# Patient Record
Sex: Female | Born: 1935 | Race: Black or African American | Hispanic: No | Smoking: Never smoker
Health system: Southern US, Community
[De-identification: ages and names within clinical notes are randomized; demographics above are authoritative.]

## PROBLEM LIST (undated history)

## (undated) DIAGNOSIS — J189 Pneumonia, unspecified organism: Secondary | ICD-10-CM

## (undated) DIAGNOSIS — G309 Alzheimer's disease, unspecified: Secondary | ICD-10-CM

## (undated) DIAGNOSIS — I10 Essential (primary) hypertension: Secondary | ICD-10-CM

## (undated) DIAGNOSIS — E559 Vitamin D deficiency, unspecified: Secondary | ICD-10-CM

## (undated) DIAGNOSIS — F800 Phonological disorder: Secondary | ICD-10-CM

## (undated) DIAGNOSIS — G4700 Insomnia, unspecified: Secondary | ICD-10-CM

## (undated) DIAGNOSIS — F259 Schizoaffective disorder, unspecified: Secondary | ICD-10-CM

## (undated) DIAGNOSIS — Z7409 Other reduced mobility: Secondary | ICD-10-CM

## (undated) DIAGNOSIS — R569 Unspecified convulsions: Secondary | ICD-10-CM

## (undated) DIAGNOSIS — F0280 Dementia in other diseases classified elsewhere without behavioral disturbance: Secondary | ICD-10-CM

## (undated) DIAGNOSIS — F79 Unspecified intellectual disabilities: Secondary | ICD-10-CM

## (undated) DIAGNOSIS — Z9289 Personal history of other medical treatment: Secondary | ICD-10-CM

## (undated) DIAGNOSIS — E785 Hyperlipidemia, unspecified: Secondary | ICD-10-CM

## (undated) DIAGNOSIS — K219 Gastro-esophageal reflux disease without esophagitis: Secondary | ICD-10-CM

## (undated) HISTORY — PX: STOMACH SURGERY: SHX791

---

## 2014-04-29 ENCOUNTER — Emergency Department (HOSPITAL_COMMUNITY): Payer: MEDICARE

## 2014-04-29 ENCOUNTER — Encounter (HOSPITAL_COMMUNITY): Payer: Self-pay | Admitting: Emergency Medicine

## 2014-04-29 ENCOUNTER — Emergency Department (HOSPITAL_COMMUNITY)
Admission: EM | Admit: 2014-04-29 | Discharge: 2014-04-29 | Disposition: A | Discharge status: Other Acute Inpatient Hospital | Payer: MEDICARE | Attending: Emergency Medicine | Admitting: Emergency Medicine

## 2014-04-29 DIAGNOSIS — Y9389 Activity, other specified: Secondary | ICD-10-CM | POA: Diagnosis not present

## 2014-04-29 DIAGNOSIS — W1839XA Other fall on same level, initial encounter: Secondary | ICD-10-CM | POA: Insufficient documentation

## 2014-04-29 DIAGNOSIS — R55 Syncope and collapse: Secondary | ICD-10-CM | POA: Diagnosis present

## 2014-04-29 DIAGNOSIS — I10 Essential (primary) hypertension: Secondary | ICD-10-CM | POA: Insufficient documentation

## 2014-04-29 DIAGNOSIS — Y998 Other external cause status: Secondary | ICD-10-CM | POA: Insufficient documentation

## 2014-04-29 DIAGNOSIS — F0281 Dementia in other diseases classified elsewhere with behavioral disturbance: Secondary | ICD-10-CM | POA: Diagnosis not present

## 2014-04-29 DIAGNOSIS — Z8639 Personal history of other endocrine, nutritional and metabolic disease: Secondary | ICD-10-CM | POA: Diagnosis not present

## 2014-04-29 DIAGNOSIS — R000 Tachycardia, unspecified: Secondary | ICD-10-CM | POA: Diagnosis not present

## 2014-04-29 DIAGNOSIS — W19XXXA Unspecified fall, initial encounter: Secondary | ICD-10-CM

## 2014-04-29 DIAGNOSIS — Z041 Encounter for examination and observation following transport accident: Secondary | ICD-10-CM | POA: Diagnosis not present

## 2014-04-29 DIAGNOSIS — G309 Alzheimer's disease, unspecified: Secondary | ICD-10-CM | POA: Insufficient documentation

## 2014-04-29 DIAGNOSIS — Z8719 Personal history of other diseases of the digestive system: Secondary | ICD-10-CM | POA: Insufficient documentation

## 2014-04-29 DIAGNOSIS — Y92000 Kitchen of unspecified non-institutional (private) residence as  the place of occurrence of the external cause: Secondary | ICD-10-CM | POA: Diagnosis not present

## 2014-04-29 HISTORY — DX: Alzheimer's disease, unspecified: G30.9

## 2014-04-29 HISTORY — DX: Gastro-esophageal reflux disease without esophagitis: K21.9

## 2014-04-29 HISTORY — DX: Unspecified convulsions: R56.9

## 2014-04-29 HISTORY — DX: Schizoaffective disorder, unspecified: F25.9

## 2014-04-29 HISTORY — DX: Essential (primary) hypertension: I10

## 2014-04-29 HISTORY — DX: Insomnia, unspecified: G47.00

## 2014-04-29 HISTORY — DX: Vitamin D deficiency, unspecified: E55.9

## 2014-04-29 HISTORY — DX: Dementia in other diseases classified elsewhere, unspecified severity, without behavioral disturbance, psychotic disturbance, mood disturbance, and anxiety: F02.80

## 2014-04-29 HISTORY — DX: Hyperlipidemia, unspecified: E78.5

## 2014-04-29 LAB — COMPREHENSIVE METABOLIC PANEL
ALK PHOS: 85 U/L (ref 39–117)
ALT: 12 U/L (ref 0–35)
AST: 20 U/L (ref 0–37)
Albumin: 3.2 g/dL — ABNORMAL LOW (ref 3.5–5.2)
Anion gap: 9 (ref 5–15)
BILIRUBIN TOTAL: 0.4 mg/dL (ref 0.3–1.2)
BUN: 13 mg/dL (ref 6–23)
CHLORIDE: 101 meq/L (ref 96–112)
CO2: 29 meq/L (ref 19–32)
Calcium: 9.3 mg/dL (ref 8.4–10.5)
Creatinine, Ser: 0.69 mg/dL (ref 0.50–1.10)
GFR, EST NON AFRICAN AMERICAN: 78 mL/min — AB (ref >90)
GLUCOSE: 144 mg/dL — AB (ref 70–99)
POTASSIUM: 3.8 meq/L (ref 3.7–5.3)
SODIUM: 139 meq/L (ref 137–147)
TOTAL PROTEIN: 7.2 g/dL (ref 6.0–8.3)

## 2014-04-29 LAB — CBC WITH DIFFERENTIAL/PLATELET
Basophils Absolute: 0 10*3/uL (ref 0.0–0.1)
Basophils Relative: 0 % (ref 0–1)
Eosinophils Absolute: 0 10*3/uL (ref 0.0–0.7)
Eosinophils Relative: 0 % (ref 0–5)
HCT: 38.1 % (ref 36.0–46.0)
Hemoglobin: 12.6 g/dL (ref 12.0–15.0)
LYMPHS PCT: 13 % (ref 12–46)
Lymphs Abs: 0.5 10*3/uL — ABNORMAL LOW (ref 0.7–4.0)
MCH: 30.9 pg (ref 26.0–34.0)
MCHC: 33.1 g/dL (ref 30.0–36.0)
MCV: 93.4 fL (ref 78.0–100.0)
Monocytes Absolute: 0.4 10*3/uL (ref 0.1–1.0)
Monocytes Relative: 10 % (ref 3–12)
NEUTROS PCT: 77 % (ref 43–77)
Neutro Abs: 3.1 10*3/uL (ref 1.7–7.7)
PLATELETS: 123 10*3/uL — AB (ref 150–400)
RBC: 4.08 MIL/uL (ref 3.87–5.11)
RDW: 13.7 % (ref 11.5–15.5)
WBC: 4.1 10*3/uL (ref 4.0–10.5)

## 2014-04-29 LAB — URINALYSIS, ROUTINE W REFLEX MICROSCOPIC
BILIRUBIN URINE: NEGATIVE
Glucose, UA: NEGATIVE mg/dL
HGB URINE DIPSTICK: NEGATIVE
KETONES UR: NEGATIVE mg/dL
Leukocytes, UA: NEGATIVE
Nitrite: NEGATIVE
Protein, ur: NEGATIVE mg/dL
Specific Gravity, Urine: 1.02 (ref 1.005–1.030)
UROBILINOGEN UA: 1 mg/dL (ref 0.0–1.0)
pH: 7 (ref 5.0–8.0)

## 2014-04-29 NOTE — Discharge Instructions (Signed)
Fall Prevention and Home Safety Falls cause injuries and can affect all age groups. It is possible to use preventive measures to significantly decrease the likelihood of falls. There are many simple measures which can make your home safer and prevent falls. OUTDOORS  Repair cracks and edges of walkways and driveways.  Remove high doorway thresholds.  Trim shrubbery on the main path into your home.  Have good outside lighting.  Clear walkways of tools, rocks, debris, and clutter.  Check that handrails are not broken and are securely fastened. Both sides of steps should have handrails.  Have leaves, snow, and ice cleared regularly.  Use sand or salt on walkways during winter months.  In the garage, clean up grease or oil spills. BATHROOM  Install night lights.  Install grab bars by the toilet and in the tub and shower.  Use non-skid mats or decals in the tub or shower.  Place a plastic non-slip stool in the shower to sit on, if needed.  Keep floors dry and clean up all water on the floor immediately.  Remove soap buildup in the tub or shower on a regular basis.  Secure bath mats with non-slip, double-sided rug tape.  Remove throw rugs and tripping hazards from the floors. BEDROOMS  Install night lights.  Make sure a bedside light is easy to reach.  Do not use oversized bedding.  Keep a telephone by your bedside.  Have a firm chair with side arms to use for getting dressed.  Remove throw rugs and tripping hazards from the floor. KITCHEN  Keep handles on pots and pans turned toward the center of the stove. Use back burners when possible.  Clean up spills quickly and allow time for drying.  Avoid walking on wet floors.  Avoid hot utensils and knives.  Position shelves so they are not too high or low.  Place commonly used objects within easy reach.  If necessary, use a sturdy step stool with a grab bar when reaching.  Keep electrical cables out of the  way.  Do not use floor polish or wax that makes floors slippery. If you must use wax, use non-skid floor wax.  Remove throw rugs and tripping hazards from the floor. STAIRWAYS  Never leave objects on stairs.  Place handrails on both sides of stairways and use them. Fix any loose handrails. Make sure handrails on both sides of the stairways are as long as the stairs.  Check carpeting to make sure it is firmly attached along stairs. Make repairs to worn or loose carpet promptly.  Avoid placing throw rugs at the top or bottom of stairways, or properly secure the rug with carpet tape to prevent slippage. Get rid of throw rugs, if possible.  Have an electrician put in a light switch at the top and bottom of the stairs. OTHER FALL PREVENTION TIPS  Wear low-heel or rubber-soled shoes that are supportive and fit well. Wear closed toe shoes.  When using a stepladder, make sure it is fully opened and both spreaders are firmly locked. Do not climb a closed stepladder.  Add color or contrast paint or tape to grab bars and handrails in your home. Place contrasting color strips on first and last steps.  Learn and use mobility aids as needed. Install an electrical emergency response system.  Turn on lights to avoid dark areas. Replace light bulbs that burn out immediately. Get light switches that glow.  Arrange furniture to create clear pathways. Keep furniture in the same place.  Firmly attach carpet with non-skid or double-sided tape.  Eliminate uneven floor surfaces.  Select a carpet pattern that does not visually hide the edge of steps.  Be aware of all pets. OTHER HOME SAFETY TIPS  Set the water temperature for 120 F (48.8 C).  Keep emergency numbers on or near the telephone.  Keep smoke detectors on every level of the home and near sleeping areas. Document Released: 05/09/2002 Document Revised: 11/18/2011 Document Reviewed: 08/08/2011 [facility] Patient Information 2015  [place], [place]. This information is not intended to replace advice given to you by your health care provider. Make sure you discuss any questions you have with your health care provider.    Syncope Syncope is a medical term for fainting or passing out. This means you lose consciousness and drop to the ground. People are generally unconscious for less than 5 minutes. You may have some muscle twitches for up to 15 seconds before waking up and returning to normal. Syncope occurs more often in older adults, but it can happen to anyone. While most causes of syncope are not dangerous, syncope can be a sign of a serious medical problem. It is important to seek medical care.  CAUSES  Syncope is caused by a sudden drop in blood flow to the brain. The specific cause is often not determined. Factors that can bring on syncope include:  Taking medicines that lower blood pressure.  Sudden changes in posture, such as standing up quickly.  Taking more medicine than prescribed.  Standing in one place for too long.  Seizure disorders.  Dehydration and excessive exposure to heat.  Low blood sugar (hypoglycemia).  Straining to have a bowel movement.  Heart disease, irregular heartbeat, or other circulatory problems.  Fear, emotional distress, seeing blood, or severe pain. SYMPTOMS  Right before fainting, you may:  Feel dizzy or light-headed.  Feel nauseous.  See all white or all black in your field of vision.  Have cold, clammy skin. DIAGNOSIS  Your health care provider will ask about your symptoms, perform a physical exam, and perform an electrocardiogram (ECG) to record the electrical activity of your heart. Your health care provider may also perform other heart or blood tests to determine the cause of your syncope which may include:  Transthoracic echocardiogram (TTE). During echocardiography, sound waves are used to evaluate how blood flows through your heart.  Transesophageal  echocardiogram (TEE).  Cardiac monitoring. This allows your health care provider to monitor your heart rate and rhythm in real time.  Holter monitor. This is a portable device that records your heartbeat and can help diagnose heart arrhythmias. It allows your health care provider to track your heart activity for several days, if needed.  Stress tests by exercise or by giving medicine that makes the heart beat faster. TREATMENT  In most cases, no treatment is needed. Depending on the cause of your syncope, your health care provider may recommend changing or stopping some of your medicines. HOME CARE INSTRUCTIONS  Have someone stay with you until you feel stable.  Do not drive, use machinery, or play sports until your health care provider says it is okay.  Keep all follow-up appointments as directed by your health care provider.  Lie down right away if you start feeling like you might faint. Breathe deeply and steadily. Wait until all the symptoms have passed.  Drink enough fluids to keep your urine clear or pale yellow.  If you are taking blood pressure or heart medicine, get up slowly and  take several minutes to sit and then stand. This can reduce dizziness. SEEK IMMEDIATE MEDICAL CARE IF:   You have a severe headache.  You have unusual pain in the chest, abdomen, or back.  You are bleeding from your mouth or rectum, or you have black or tarry stool.  You have an irregular or very fast heartbeat.  You have pain with breathing.  You have repeated fainting or seizure-like jerking during an episode.  You faint when sitting or lying down.  You have confusion.  You have trouble walking.  You have severe weakness.  You have vision problems. If you fainted, call your local emergency services (911 in [place] Do not drive yourself to the hospital.  MAKE SURE YOU:  Understand these instructions.  Will watch your condition.  Will get help right away if you are not doing well  or get worse. Document Released: 05/19/2005 Document Revised: 05/24/2013 Document Reviewed: 07/18/2011 [facility]Exit[facility] Patient Information 2015 [place], [place]. This information is not intended to replace advice given to you by your health care provider. Make sure you discuss any questions you have with your health care provider.   Seizure, Adult A seizure is abnormal electrical activity in the brain. Seizures usually last from 30 seconds to 2 minutes. There are various types of seizures. Before a seizure, you may have a warning sensation (aura) that a seizure is about to occur. An aura may include the following symptoms:   Fear or anxiety.  Nausea.  Feeling like the room is spinning (vertigo).  Vision changes, such as seeing flashing lights or spots. Common symptoms during a seizure include:  A change in attention or behavior (altered mental status).  Convulsions with rhythmic jerking movements.  Drooling.  Rapid eye movements.  Grunting.  Loss of bladder and bowel control.  Bitter taste in the mouth.  Tongue biting. After a seizure, you may feel confused and sleepy. You may also have an injury resulting from convulsions during the seizure. HOME CARE INSTRUCTIONS   If you are given medicines, take them exactly as prescribed by your health care provider.  Keep all follow-up appointments as directed by your health care provider.  Do not swim or drive or engage in risky activity during which a seizure could cause further injury to you or others until your health care provider says it is OK.  Get adequate rest.  Teach friends and family what to do if you have a seizure. They should:  Lay you on the ground to prevent a fall.  Put a cushion under your head.  Loosen any tight clothing around your neck.  Turn you on your side. If vomiting occurs, this helps keep your airway clear.  Stay with you until you recover.  Know whether or not you need emergency care. SEEK IMMEDIATE  MEDICAL CARE IF:  The seizure lasts longer than 5 minutes.  The seizure is severe or you do not wake up immediately after the seizure.  You have an altered mental status after the seizure.  You are having more frequent or worsening seizures. Someone should drive you to the emergency department or call local emergency services (911 in [place] MAKE SURE YOU:  Understand these instructions.  Will watch your condition.  Will get help right away if you are not doing well or get worse. Document Released: 05/16/2000 Document Revised: 03/09/2013 Document Reviewed: 12/29/2012 [facility]Exit[facility] Patient Information 2015 [place]Care, [place]. This information is not intended to replace advice given to you by your health care provider.  Make sure you discuss any questions you have with your health care provider. ° ° °

## 2014-04-29 NOTE — ED Provider Notes (Signed)
CSN: [id number]     Arrival date & time 04/29/14  [id number] History   First MD Initiated Contact with Patient 04/29/14 0745    This chart was scribed for [name] CamelScott T Iness Pangilinan, MD by Marica OtterNusrat Rahman, ED Scribe. This patient was seen in room APA06/APA06 and the patient's care was started at 7:48 AM. Level 5 Caveat: Dementia  Chief Complaint  Patient presents with  . Loss of Consciousness   The history is provided by the EMS personnel and the nursing home. No language interpreter was used.   PCP: No primary care provider on file. HPI Comments: [name] Reynolds is a 80 y.o. female, who resides in assisted living and brought in by ambulance, with medical Hx noted below including dementia, seizures, who presents to the Emergency Department complaining of a fall with associated  LOC sustained prior to arrival to the ED. Per assisted living representative, pt was sitting in a chair and had an unwitnessed fall. Representative further notes that pt was unresponsive following the fall and believes that pt may have been having a seizure episode due to shaking noticed. Representative denies vomiting. Unknown if she passed out and fell, or if she lost consciousness after the fall.  Past Medical History  Diagnosis Date  . HTN (hypertension)   . Hyperlipemia   . Seizures   . GERD (gastroesophageal reflux disease)   . Alzheimer's dementia   . Insomnia   . Vitamin D deficiency   . Schizoaffective disorder    Past Surgical History  Procedure Laterality Date  . Stomach surgery     No family history on file. History  Substance Use Topics  . Smoking status: Never Smoker   . Smokeless tobacco: Not on file  . Alcohol Use: No   OB History    No data available     Review of Systems  Unable to perform ROS: Dementia      Allergies  Review of patient's allergies indicates no known allergies.  Home Medications   Prior to Admission medications   Not on File   Triage Vitals: BP 138/84 mmHg  Pulse 108   Temp(Src) 98.1 F (36.7 C) (Oral)  Resp 20  Ht 5\' 6"  (1.676 m)  Wt 120 lb (54.432 kg)  BMI 19.38 kg/m2  SpO2 100% Physical Exam  Constitutional: She appears well-developed and well-nourished. No distress.  Cachectic appearance.   HENT:  Head: Normocephalic and atraumatic.  Right Ear: External ear normal.  Left Ear: External ear normal.  Nose: Nose normal.  Eyes: Pupils are equal, round, and reactive to light.  Neck: Neck supple. No tracheal deviation present.  Cardiovascular: Regular rhythm and normal heart sounds.  Tachycardia present.   Pulmonary/Chest: Effort normal. No respiratory distress.  Abdominal: Soft. She exhibits no distension. There is no tenderness.  Musculoskeletal: Normal range of motion.  No gross tenderness over pelvis or hips. No pain with ROM of extremities  Neurological: She is alert.  Nonverbal and does not follow commands. Strength appears nl in all 4 extremities. Moving all 4 extremities grossly normally.    Skin: Skin is warm and dry.  Psychiatric: She has a normal mood and affect. Her behavior is normal.  Nursing note and vitals reviewed.   ED Course  Procedures (including critical care time) DIAGNOSTIC STUDIES: Oxygen Saturation is 100% on RA, nl by my interpretation.     Labs Review Labs Reviewed  CBC WITH DIFFERENTIAL - Abnormal; Notable for the following:    Platelets 123 (*)  Lymphs Abs 0.5 (*)    All other components within normal limits  COMPREHENSIVE METABOLIC PANEL - Abnormal; Notable for the following:    Glucose, Bld 144 (*)    Albumin 3.2 (*)    GFR calc non Af Amer 78 (*)    All other components within normal limits  URINALYSIS, ROUTINE W REFLEX MICROSCOPIC    Imaging Review Ct Head Wo Contrast  04/29/2014   CLINICAL DATA:  Loss of consciousness secondary to a fall today. Possible seizure. History of seizures. Dementia.  EXAM: CT HEAD WITHOUT CONTRAST  TECHNIQUE: Contiguous axial images were obtained from the base of the  skull through the vertex without intravenous contrast.  COMPARISON:  None.  FINDINGS: There is no acute intracranial hemorrhage, infarction, or mass lesion. There are old small lacunar infarcts in the anterior limbs of both internal capsules. There is no ventricular dilatation or significant atrophy. No osseous abnormality.  IMPRESSION: No acute intracranial abnormality.  Old lacunar infarcts.   Electronically Signed   By: Geanie CooleyJim  Maxwell M.D.   On: 04/29/2014 08:43     EKG Interpretation   Date/Time:  Saturday April 29 2014 07:39:18 EST Ventricular Rate:  106 PR Interval:  178 QRS Duration: 72 QT Interval:  351 QTC Calculation: 466 R Axis:   35 Text Interpretation:  Sinus tachycardia Abnormal R-wave progression, early  transition No old tracing to compare Confirmed by Matej Sappenfield  MD, Yumi Insalaco  ([id number]) on 04/29/2014 7:59:50 AM      MDM   Final diagnoses:  Fall, initial encounter    Patient with syncope vs seizure. Patient is unable to provide a history, but is currently on treatment of chronic history of seizures. Workup is unremarkable except mild tachycardia. However patient is at mental baseline per multiple visitors and appears to have no acute emergent condition. I feel this was likely a fall from chair vs brief seizure. Did discuss possible observation for possibility of syncope, but sister wants to take her back to group home and f/u with her PCP.  I personally performed the services described in this documentation, which was scribed in my presence. The recorded information has been reviewed and is accurate.     [name] CamelScott T Atticus Wedin, MD 04/30/14 [phone]

## 2014-04-29 NOTE — ED Notes (Signed)
ALF reported to EMS that pt was sitting in a chair in the kitchen and fell to the floor with a period of LOC. PT has dementia and is non-verbal with hx of alzheimer's. PT alert and obeys commands on arrival to ED. EMS reports sinus tach on monitor with glucose of 106.

## 2014-10-11 ENCOUNTER — Inpatient Hospital Stay (HOSPITAL_COMMUNITY)
Admission: EM | Admit: 2014-10-11 | Discharge: 2014-10-13 | DRG: 871 | Disposition: A | Discharge status: Home / Self Care | Payer: MEDICARE | Attending: Internal Medicine | Admitting: Internal Medicine

## 2014-10-11 ENCOUNTER — Emergency Department (HOSPITAL_COMMUNITY): Payer: MEDICARE

## 2014-10-11 ENCOUNTER — Encounter (HOSPITAL_COMMUNITY): Payer: Self-pay

## 2014-10-11 DIAGNOSIS — E860 Dehydration: Secondary | ICD-10-CM | POA: Diagnosis present

## 2014-10-11 DIAGNOSIS — G309 Alzheimer's disease, unspecified: Secondary | ICD-10-CM | POA: Diagnosis present

## 2014-10-11 DIAGNOSIS — F0280 Dementia in other diseases classified elsewhere without behavioral disturbance: Secondary | ICD-10-CM | POA: Diagnosis present

## 2014-10-11 DIAGNOSIS — F259 Schizoaffective disorder, unspecified: Secondary | ICD-10-CM | POA: Diagnosis present

## 2014-10-11 DIAGNOSIS — R7401 Elevation of levels of liver transaminase levels: Secondary | ICD-10-CM

## 2014-10-11 DIAGNOSIS — A419 Sepsis, unspecified organism: Principal | ICD-10-CM | POA: Diagnosis present

## 2014-10-11 DIAGNOSIS — R740 Nonspecific elevation of levels of transaminase and lactic acid dehydrogenase [LDH]: Secondary | ICD-10-CM

## 2014-10-11 DIAGNOSIS — G9340 Encephalopathy, unspecified: Secondary | ICD-10-CM | POA: Diagnosis present

## 2014-10-11 DIAGNOSIS — R4182 Altered mental status, unspecified: Secondary | ICD-10-CM

## 2014-10-11 DIAGNOSIS — E785 Hyperlipidemia, unspecified: Secondary | ICD-10-CM | POA: Diagnosis present

## 2014-10-11 DIAGNOSIS — Z681 Body mass index (BMI) 19 or less, adult: Secondary | ICD-10-CM

## 2014-10-11 DIAGNOSIS — B349 Viral infection, unspecified: Secondary | ICD-10-CM | POA: Diagnosis present

## 2014-10-11 DIAGNOSIS — I10 Essential (primary) hypertension: Secondary | ICD-10-CM | POA: Diagnosis present

## 2014-10-11 DIAGNOSIS — R627 Adult failure to thrive: Secondary | ICD-10-CM | POA: Diagnosis present

## 2014-10-11 DIAGNOSIS — R109 Unspecified abdominal pain: Secondary | ICD-10-CM

## 2014-10-11 DIAGNOSIS — K219 Gastro-esophageal reflux disease without esophagitis: Secondary | ICD-10-CM | POA: Diagnosis present

## 2014-10-11 DIAGNOSIS — E43 Unspecified severe protein-calorie malnutrition: Secondary | ICD-10-CM | POA: Diagnosis present

## 2014-10-11 LAB — CBC WITH DIFFERENTIAL/PLATELET
Basophils Absolute: 0 10*3/uL (ref 0.0–0.1)
Basophils Relative: 0 % (ref 0–1)
EOS ABS: 0 10*3/uL (ref 0.0–0.7)
Eosinophils Relative: 0 % (ref 0–5)
HCT: 39.6 % (ref 36.0–46.0)
HEMOGLOBIN: 13.2 g/dL (ref 12.0–15.0)
LYMPHS ABS: 0.3 10*3/uL — AB (ref 0.7–4.0)
Lymphocytes Relative: 4 % — ABNORMAL LOW (ref 12–46)
MCH: 32.1 pg (ref 26.0–34.0)
MCHC: 33.3 g/dL (ref 30.0–36.0)
MCV: 96.4 fL (ref 78.0–100.0)
Monocytes Absolute: 0.5 10*3/uL (ref 0.1–1.0)
Monocytes Relative: 6 % (ref 3–12)
NEUTROS ABS: 7.4 10*3/uL (ref 1.7–7.7)
Neutrophils Relative %: 90 % — ABNORMAL HIGH (ref 43–77)
Platelets: 108 10*3/uL — ABNORMAL LOW (ref 150–400)
RBC: 4.11 MIL/uL (ref 3.87–5.11)
RDW: 13.6 % (ref 11.5–15.5)
WBC: 8.2 10*3/uL (ref 4.0–10.5)

## 2014-10-11 LAB — COMPREHENSIVE METABOLIC PANEL
ALBUMIN: 3.7 g/dL (ref 3.5–5.0)
ALK PHOS: 236 U/L — AB (ref 38–126)
ALT: 299 U/L — ABNORMAL HIGH (ref 14–54)
ANION GAP: 8 (ref 5–15)
AST: 335 U/L — AB (ref 15–41)
BUN: 22 mg/dL — AB (ref 6–20)
CO2: 29 mmol/L (ref 22–32)
Calcium: 9.8 mg/dL (ref 8.9–10.3)
Chloride: 101 mmol/L (ref 101–111)
Creatinine, Ser: 0.77 mg/dL (ref 0.44–1.00)
GFR calc Af Amer: >60 mL/min (ref >60)
GFR calc non Af Amer: >60 mL/min (ref >60)
Glucose, Bld: 140 mg/dL — ABNORMAL HIGH (ref 70–99)
POTASSIUM: 4.2 mmol/L (ref 3.5–5.1)
Sodium: 138 mmol/L (ref 135–145)
TOTAL PROTEIN: 8.3 g/dL — AB (ref 6.5–8.1)
Total Bilirubin: 2.9 mg/dL — ABNORMAL HIGH (ref 0.3–1.2)

## 2014-10-11 LAB — URINALYSIS, ROUTINE W REFLEX MICROSCOPIC
GLUCOSE, UA: 100 mg/dL — AB
HGB URINE DIPSTICK: NEGATIVE
KETONES UR: NEGATIVE mg/dL
LEUKOCYTES UA: NEGATIVE
Nitrite: NEGATIVE
PROTEIN: NEGATIVE mg/dL
Specific Gravity, Urine: >1.03 — ABNORMAL HIGH (ref 1.005–1.030)
pH: 5.5 (ref 5.0–8.0)

## 2014-10-11 LAB — LIPASE, BLOOD: Lipase: 20 U/L — ABNORMAL LOW (ref 22–51)

## 2014-10-11 LAB — I-STAT CG4 LACTIC ACID, ED
LACTIC ACID, VENOUS: 2.21 mmol/L — AB (ref 0.5–2.0)
LACTIC ACID, VENOUS: 1.2 mmol/L (ref 0.5–2.0)

## 2014-10-11 MED ORDER — PIPERACILLIN-TAZOBACTAM 3.375 G IVPB
3.3750 g | Freq: Three times a day (TID) | INTRAVENOUS | Status: DC
  Administered 2014-10-12 – 2014-10-13 (×4): 3.375 g via INTRAVENOUS
  Filled 2014-10-11 (×14): qty 50

## 2014-10-11 MED ORDER — VANCOMYCIN HCL IN DEXTROSE 1-5 GM/200ML-% IV SOLN
1000.0000 mg | Freq: Once | INTRAVENOUS | Status: AC
  Administered 2014-10-11: 1000 mg via INTRAVENOUS
  Filled 2014-10-11: qty 200

## 2014-10-11 MED ORDER — VANCOMYCIN HCL IN DEXTROSE 750-5 MG/150ML-% IV SOLN
750.0000 mg | INTRAVENOUS | Status: DC
  Administered 2014-10-12: 750 mg via INTRAVENOUS
  Filled 2014-10-11 (×4): qty 150

## 2014-10-11 MED ORDER — PIPERACILLIN-TAZOBACTAM 3.375 G IVPB 30 MIN
3.3750 g | Freq: Once | INTRAVENOUS | Status: AC
  Administered 2014-10-11: 3.375 g via INTRAVENOUS
  Filled 2014-10-11: qty 50

## 2014-10-11 MED ORDER — IOHEXOL 300 MG/ML  SOLN
100.0000 mL | Freq: Once | INTRAMUSCULAR | Status: AC | PRN
  Administered 2014-10-11: 100 mL via INTRAVENOUS

## 2014-10-11 MED ORDER — SODIUM CHLORIDE 0.9 % IV BOLUS (SEPSIS)
500.0000 mL | Freq: Once | INTRAVENOUS | Status: AC
  Administered 2014-10-11: 500 mL via INTRAVENOUS

## 2014-10-11 MED ORDER — SODIUM CHLORIDE 0.9 % IV SOLN: INTRAVENOUS | Status: DC

## 2014-10-11 MED ORDER — SODIUM CHLORIDE 0.9 % IV BOLUS (SEPSIS)
500.0000 mL | Freq: Once | INTRAVENOUS | Status: AC
Start: 2014-10-11 — End: 2014-10-11
  Administered 2014-10-11: 500 mL via INTRAVENOUS

## 2014-10-11 NOTE — ED Notes (Signed)
Transported by EMS for AMS. Caregivers stated pt is less responsive today than usual.

## 2014-10-11 NOTE — Progress Notes (Signed)
ANTIBIOTIC CONSULT NOTE  Pharmacy Consult for Vancomycin & Zosyn  Indication: rule out sepsis  No Known Allergies  Patient Measurements:    Vital Signs: Temp: 99.1 F (37.3 C) (05/11 1622) Temp Source: Oral (05/11 1622) BP: 139/73 mmHg (05/11 2200) Pulse Rate: 107 (05/11 2200) Intake/Output from previous day:   Intake/Output from this shift:    Labs:  Recent Labs  10/11/14 1815  WBC 8.2  HGB 13.2  PLT 108*  CREATININE 0.77   CrCl cannot be calculated (Unknown ideal weight.). No results for input(s): VANCOTROUGH, VANCOPEAK, VANCORANDOM, GENTTROUGH, GENTPEAK, GENTRANDOM, TOBRATROUGH, TOBRAPEAK, TOBRARND, AMIKACINPEAK, AMIKACINTROU, AMIKACIN in the last 72 hours.   Microbiology: Recent Results (from the past 720 hour(s))  Culture, blood (routine x 2)     Status: None (Preliminary result)   Collection Time: 10/11/14  8:50 PM  Result Value Ref Range Status   Specimen Description BLOOD RIGHT HAND  Final   Special Requests BOTTLES DRAWN AEROBIC ONLY 6CC  Final   Culture PENDING  Incomplete   Report Status PENDING  Incomplete  Culture, blood (routine x 2)     Status: None (Preliminary result)   Collection Time: 10/11/14  8:55 PM  Result Value Ref Range Status   Specimen Description BLOOD RIGHT HAND  Final   Special Requests BOTTLES DRAWN AEROBIC AND ANAEROBIC 6CC  Final   Culture PENDING  Incomplete   Report Status PENDING  Incomplete    Anti-infectives    Start     Dose/Rate Route Frequency Ordered Stop   10/11/14 2015  piperacillin-tazobactam (ZOSYN) IVPB 3.375 g     3.375 g 100 mL/hr over 30 Minutes Intravenous  Once 10/11/14 2012 10/11/14 2130   10/11/14 2015  vancomycin (VANCOCIN) IVPB 1000 mg/200 mL premix     1,000 mg 200 mL/hr over 60 Minutes Intravenous  Once 10/11/14 2012 10/11/14 2230      Assessment: 81 yo F who presented with altered mental status. Initial lactic acid level elevated, but trending down.  She is afebrile with normal WBC.   Estimated  CrCl ~ 35-6740ml/min (based on weight from Nov 2015). Initial antibiotic doses given in ED.   Goal of Therapy:  Vancomycin trough level 15-20 mcg/ml  Plan:  Zosyn 3.375gm IV Q8h to be infused over 4hrs Vancomycin 750mg  IV q24h Check Vancomycin trough at steady state Monitor renal function and cx data   Elson ClanLilliston, Opel Lejeune Michelle 10/11/2014,11:04 PM

## 2014-10-12 ENCOUNTER — Observation Stay (HOSPITAL_COMMUNITY): Payer: MEDICARE

## 2014-10-12 ENCOUNTER — Encounter (HOSPITAL_COMMUNITY): Payer: Self-pay

## 2014-10-12 DIAGNOSIS — R740 Nonspecific elevation of levels of transaminase and lactic acid dehydrogenase [LDH]: Secondary | ICD-10-CM

## 2014-10-12 DIAGNOSIS — A4151 Sepsis due to Escherichia coli [E. coli]: Secondary | ICD-10-CM | POA: Insufficient documentation

## 2014-10-12 DIAGNOSIS — R7401 Elevation of levels of liver transaminase levels: Secondary | ICD-10-CM | POA: Diagnosis present

## 2014-10-12 DIAGNOSIS — R4182 Altered mental status, unspecified: Secondary | ICD-10-CM

## 2014-10-12 DIAGNOSIS — G9340 Encephalopathy, unspecified: Secondary | ICD-10-CM | POA: Diagnosis present

## 2014-10-12 LAB — CBC WITH DIFFERENTIAL/PLATELET
Basophils Absolute: 0 10*3/uL (ref 0.0–0.1)
Basophils Relative: 0 % (ref 0–1)
Eosinophils Absolute: 0 10*3/uL (ref 0.0–0.7)
Eosinophils Relative: 0 % (ref 0–5)
HCT: 39.9 % (ref 36.0–46.0)
HEMOGLOBIN: 13.1 g/dL (ref 12.0–15.0)
LYMPHS ABS: 0.8 10*3/uL (ref 0.7–4.0)
Lymphocytes Relative: 7 % — ABNORMAL LOW (ref 12–46)
MCH: 31.7 pg (ref 26.0–34.0)
MCHC: 32.8 g/dL (ref 30.0–36.0)
MCV: 96.6 fL (ref 78.0–100.0)
MONOS PCT: 7 % (ref 3–12)
Monocytes Absolute: 0.7 10*3/uL (ref 0.1–1.0)
NEUTROS ABS: 9 10*3/uL — AB (ref 1.7–7.7)
NEUTROS PCT: 86 % — AB (ref 43–77)
PLATELETS: 98 10*3/uL — AB (ref 150–400)
RBC: 4.13 MIL/uL (ref 3.87–5.11)
RDW: 14 % (ref 11.5–15.5)
WBC: 10.5 10*3/uL (ref 4.0–10.5)

## 2014-10-12 LAB — COMPREHENSIVE METABOLIC PANEL
ALT: 195 U/L — AB (ref 14–54)
AST: 156 U/L — AB (ref 15–41)
Albumin: 3.1 g/dL — ABNORMAL LOW (ref 3.5–5.0)
Alkaline Phosphatase: 174 U/L — ABNORMAL HIGH (ref 38–126)
Anion gap: 5 (ref 5–15)
BUN: 16 mg/dL (ref 6–20)
CO2: 27 mmol/L (ref 22–32)
Calcium: 9 mg/dL (ref 8.9–10.3)
Chloride: 109 mmol/L (ref 101–111)
Creatinine, Ser: 0.6 mg/dL (ref 0.44–1.00)
GFR calc Af Amer: >60 mL/min (ref >60)
GLUCOSE: 92 mg/dL (ref 65–99)
POTASSIUM: 3.7 mmol/L (ref 3.5–5.1)
SODIUM: 141 mmol/L (ref 135–145)
TOTAL PROTEIN: 7.1 g/dL (ref 6.5–8.1)
Total Bilirubin: 2.4 mg/dL — ABNORMAL HIGH (ref 0.3–1.2)

## 2014-10-12 LAB — HEPATITIS PANEL, ACUTE
HCV AB: NEGATIVE
HEP B C IGM: NONREACTIVE
Hep A IgM: NONREACTIVE
Hepatitis B Surface Ag: NEGATIVE

## 2014-10-12 LAB — PROTIME-INR
INR: 1.15 (ref 0.00–1.49)
Prothrombin Time: 14.8 seconds (ref 11.6–15.2)

## 2014-10-12 LAB — PROCALCITONIN: PROCALCITONIN: 10.33 ng/mL

## 2014-10-12 LAB — ACETAMINOPHEN LEVEL: Acetaminophen (Tylenol), Serum: <10 ug/mL — ABNORMAL LOW (ref 10–30)

## 2014-10-12 LAB — AMMONIA: AMMONIA: 28 umol/L (ref 9–35)

## 2014-10-12 LAB — TSH: TSH: 2.042 u[IU]/mL (ref 0.350–4.500)

## 2014-10-12 LAB — APTT: aPTT: 24 seconds (ref 24–37)

## 2014-10-12 MED ORDER — PIPERACILLIN-TAZOBACTAM 3.375 G IVPB
INTRAVENOUS | Status: AC
  Filled 2014-10-12: qty 50

## 2014-10-12 MED ORDER — HEPARIN SODIUM (PORCINE) 5000 UNIT/ML IJ SOLN
5000.0000 [IU] | Freq: Three times a day (TID) | INTRAMUSCULAR | Status: DC
  Administered 2014-10-12 – 2014-10-13 (×4): 5000 [IU] via SUBCUTANEOUS
  Filled 2014-10-12 (×4): qty 1

## 2014-10-12 MED ORDER — VANCOMYCIN HCL IN DEXTROSE 1-5 GM/200ML-% IV SOLN: 1000.0000 mg | Freq: Once | INTRAVENOUS | Status: DC

## 2014-10-12 MED ORDER — ENSURE ENLIVE PO LIQD
237.0000 mL | ORAL | Status: DC
  Administered 2014-10-12: 237 mL via ORAL

## 2014-10-12 MED ORDER — SODIUM CHLORIDE 0.9 % IV SOLN
INTRAVENOUS | Status: DC
  Administered 2014-10-12 (×2): via INTRAVENOUS

## 2014-10-12 MED ORDER — PIPERACILLIN-TAZOBACTAM 3.375 G IVPB 30 MIN: 3.3750 g | Freq: Once | INTRAVENOUS | Status: DC

## 2014-10-12 NOTE — Progress Notes (Signed)
INITIAL NUTRITION ASSESSMENT Pt meets criteria for SEVERE MALNUTRITION in the context of CHRONIC ILLNESS as evidenced by Loss of >10% bw in 6 months and severe muscle wasting. DOCUMENTATION CODES Per approved criteria  -Severe malnutrition in the context of chronic illness -Underweight   INTERVENTION: Ensure Enlive po daily, each supplement provides 350 kcal and 20 grams of protein  Magic cup daily with lunch/dinner, each supplement provides 290 kcal and 9 grams of protein  NUTRITION DIAGNOSIS: Increased protein/calorie needs related to chronic disease as evidenced by loss of 10.8% bw in 6 months.   Goal: Pt to meet >/= 90% of their estimated nutrition needs   Monitor:  Oral intake, snack/supp preferences, labs, new height measurement, workup  Reason for Assessment: MST  81 y.o. female  Admitting Dx: <principal problem not specified>  ASSESSMENT: 81 y.o. year old female with significant past medical history of MR, schizoaffective disorder, dementia, HTN, seizures presenting with encephalopathy, transaminitis. Meets sepsis criteria, though unknown etiology  Spoke with pt sister. She says that pt's appetite drastically reduced the last couple days. The care home workers say the pt was "just picking" at her food. usually pt has a good appetite. Pt would also receive 2-3 ensure/boost supplements a day as well as snacks.  Per sister, pt does not have a "usual weight". The pt has continued to lose weight slowly despite, what she reports, as a very good appetite.   Per sister, too many ensure supplements get pt constipated. Will order 1 + magic cup  Nutrition Focused Physical Exam: Limited d/t patient agitation when I tried to examine her upper body  Subcutaneous Fat:  Orbital Region: mild fat loss Upper Arm Region: n/a Thoracic and Lumbar Region: n/a  Muscle:  Temple Region: Moderate muscle loss Clavicle Bone Region: n/a Clavicle and Acromion Bone Region: n/a Scapular Bone  Region: n/a Dorsal Hand: n/a Patellar Region: Severe muscle loss Anterior Thigh Region: severe muscle loss Posterior Calf Region: Severe Muscle Loss  Edema: none   Height: Ht Readings from Last 1 Encounters:  10/12/14 5\' 10"  (1.778 m)  Pt's height 5\' 6" : 04/29/2014  Weight: Wt Readings from Last 1 Encounters:  10/12/14 107 lb 12.9 oz (48.9 kg)    Ideal Body Weight (for 5\' 6" ) : 130 lbs  % Ideal Body Weight: 82.3%  Wt Readings from Last 10 Encounters:  10/12/14 107 lb 12.9 oz (48.9 kg)  04/29/14 120 lb (54.432 kg)  loss of 10.8% bw in 6 months-significant  Usual Body Weight: doesn't have one   BMI: With 5\' 6"  height: 17.4  (Believed error in height measurement).  Estimated Nutritional Needs: Kcal: 1500-1700  (30-35 kcals/kg) Protein: >73g (1.5 g/kg) Fluid:1.5-1.7 liter  Skin: WDL  Diet Order: DIET - DYS 1 Room service appropriate?: Yes; Fluid consistency:: Thin  EDUCATION NEEDS: -No education needs identified at this time   Intake/Output Summary (Last 24 hours) at 10/12/14 1707 Last data filed at 10/12/14 1500  Gross per 24 hour  Intake 630.42 ml  Output      0 ml  Net 630.42 ml    Last BM: Unknown   Labs:   Recent Labs Lab 10/11/14 1815 10/12/14 0759  NA 138 141  K 4.2 3.7  CL 101 109  CO2 29 27  BUN 22* 16  CREATININE 0.77 0.60  CALCIUM 9.8 9.0  GLUCOSE 140* 92    CBG (last 3)  No results for input(s): GLUCAP in the last 72 hours.  Scheduled Meds: . heparin  5,000  Units Subcutaneous 3 times per day  . piperacillin-tazobactam (ZOSYN)  IV  3.375 g Intravenous Q8H  . vancomycin  750 mg Intravenous Q24H    Continuous Infusions: . sodium chloride      Past Medical History  Diagnosis Date  . HTN (hypertension)   . Hyperlipemia   . Seizures   . GERD (gastroesophageal reflux disease)   . Alzheimer's dementia   . Insomnia   . Vitamin D deficiency   . Schizoaffective disorder     Past Surgical History  Procedure Laterality  Date  . Stomach surgery      Christophe LouisNathan Franks RD, LDN Nutrition Pager: [phone] 10/12/2014 5:07 PM

## 2014-10-12 NOTE — ED Provider Notes (Signed)
CSN: [id number]     Arrival date & time 10/11/14  1621 History   First MD Initiated Contact with Patient 10/11/14 1650     Chief Complaint  Patient presents with  . Altered Mental Status     (Consider location/radiation/quality/duration/timing/severity/associated sxs/prior Treatment) Patient is a 81 y.o. female presenting with altered mental status. The history is provided by the nursing home, the EMS personnel and a relative. The history is limited by the condition of the patient.  Altered Mental Status  level V caveat applies to the history due to the patient's altered mental status. History obtained the essentially from the family member.  Patient the resident of a nursing facility and cast will [place]. Caregivers noted that patient was less responsive than usual today. Patient is usually able to verbalize some minimal saying wasn't doing that. Patient also usually able to walk some not able to do that. Patient seemed to be complaining of some pain to the lower part of the abdomen. There was no vomiting or diarrhea no cough no apparent respiratory type symptoms.  Past Medical History  Diagnosis Date  . HTN (hypertension)   . Hyperlipemia   . Seizures   . GERD (gastroesophageal reflux disease)   . Alzheimer's dementia   . Insomnia   . Vitamin D deficiency   . Schizoaffective disorder    Past Surgical History  Procedure Laterality Date  . Stomach surgery     History reviewed. No pertinent family history. History  Substance Use Topics  . Smoking status: Never Smoker   . Smokeless tobacco: Not on file  . Alcohol Use: No   OB History    No data available     Review of Systems  Unable to perform ROS  level V caveat pertains the review of systems due to the altered mental status.    Allergies  Review of patient's allergies indicates no known allergies.  Home Medications   Prior to Admission medications   Medication Sig Start Date End Date Taking? Authorizing Provider   aspirin EC 81 MG tablet Take 81 mg by mouth daily.   Yes Historical Provider, MD  benztropine (COGENTIN) 0.5 MG tablet Take 0.5 mg by mouth 2 (two) times daily.   Yes Historical Provider, MD  cholecalciferol (VITAMIN D) 1000 UNITS tablet Take 1,000 Units by mouth daily.   Yes Historical Provider, MD  clonazePAM (KLONOPIN) 0.5 MG tablet Take 0.5 mg by mouth 2 (two) times daily.   Yes Historical Provider, MD  famotidine (PEPCID) 20 MG tablet Take 20 mg by mouth at bedtime.   Yes Historical Provider, MD  hydrOXYzine (VISTARIL) 25 MG capsule Take 25 mg by mouth at bedtime.   Yes Historical Provider, MD  lactulose, encephalopathy, (CHRONULAC) 10 GM/15ML SOLN Take 10 g by mouth daily.   Yes Historical Provider, MD  polyethylene glycol powder (GLYCOLAX/MIRALAX) powder Take 17 g by mouth daily as needed for mild constipation or moderate constipation (*Mix in 8 oz of water and drink as needed to have a bowel movement every 3 days.).   Yes Historical Provider, MD  simvastatin (ZOCOR) 40 MG tablet Take 40 mg by mouth at bedtime.   Yes Historical Provider, MD  traZODone (DESYREL) 100 MG tablet Take 200 mg by mouth at bedtime.   Yes Historical Provider, MD  ziprasidone (GEODON) 40 MG capsule Take 40 mg by mouth 2 (two) times daily with a meal.   Yes Historical Provider, MD   BP 127/74 mmHg  Pulse 104  Temp(Src)  99.1 F (37.3 C) (Oral)  Resp 29  SpO2 96% Physical Exam  Constitutional: She appears well-developed and well-nourished. No distress.  HENT:  Head: Normocephalic and atraumatic.  Mucous membranes dry  Eyes: Conjunctivae are normal. Pupils are equal, round, and reactive to light.  Neck: Normal range of motion. Neck supple.  Cardiovascular: Normal rate, regular rhythm and normal heart sounds.   No murmur heard. Pulmonary/Chest: Effort normal and breath sounds normal. No respiratory distress. She has no wheezes.  Abdominal: Soft. Bowel sounds are normal. She exhibits no mass. There is no  tenderness. There is no guarding.  Musculoskeletal: Normal range of motion. She exhibits no edema.  Neurological:  Somnolent moving all 4 extremities spontaneously however  Skin: Skin is warm. No rash noted.  Nursing note and vitals reviewed.   ED Course  Procedures (including critical care time) Labs Review Labs Reviewed  URINALYSIS, ROUTINE W REFLEX MICROSCOPIC - Abnormal; Notable for the following:    Color, Urine AMBER (*)    Specific Gravity, Urine >1.030 (*)    Glucose, UA 100 (*)    Bilirubin Urine MODERATE (*)    Urobilinogen, UA >8.0 (*)    All other components within normal limits  COMPREHENSIVE METABOLIC PANEL - Abnormal; Notable for the following:    Glucose, Bld 140 (*)    BUN 22 (*)    Total Protein 8.3 (*)    AST 335 (*)    ALT 299 (*)    Alkaline Phosphatase 236 (*)    Total Bilirubin 2.9 (*)    All other components within normal limits  LIPASE, BLOOD - Abnormal; Notable for the following:    Lipase 20 (*)    All other components within normal limits  CBC WITH DIFFERENTIAL/PLATELET - Abnormal; Notable for the following:    Platelets 108 (*)    Neutrophils Relative % 90 (*)    Lymphocytes Relative 4 (*)    Lymphs Abs 0.3 (*)    All other components within normal limits  I-STAT CG4 LACTIC ACID, ED - Abnormal; Notable for the following:    Lactic Acid, Venous 2.21 (*)    All other components within normal limits  CULTURE, BLOOD (ROUTINE X 2)  CULTURE, BLOOD (ROUTINE X 2)  AMMONIA  HEPATITIS PANEL, ACUTE  I-STAT CG4 LACTIC ACID, ED   Results for orders placed or performed during the hospital encounter of 10/11/14  Culture, blood (routine x 2)  Result Value Ref Range   Specimen Description BLOOD RIGHT HAND    Special Requests BOTTLES DRAWN AEROBIC ONLY 6CC    Culture PENDING    Report Status PENDING   Culture, blood (routine x 2)  Result Value Ref Range   Specimen Description BLOOD RIGHT HAND    Special Requests BOTTLES DRAWN AEROBIC AND ANAEROBIC  6CC    Culture PENDING    Report Status PENDING   Urinalysis, Routine w reflex microscopic  Result Value Ref Range   Color, Urine AMBER (A) YELLOW   APPearance CLEAR CLEAR   Specific Gravity, Urine >1.030 (H) 1.005 - 1.030   pH 5.5 5.0 - 8.0   Glucose, UA 100 (A) NEGATIVE mg/dL   Hgb urine dipstick NEGATIVE NEGATIVE   Bilirubin Urine MODERATE (A) NEGATIVE   Ketones, ur NEGATIVE NEGATIVE mg/dL   Protein, ur NEGATIVE NEGATIVE mg/dL   Urobilinogen, UA >3.2>8.0 (H) 0.0 - 1.0 mg/dL   Nitrite NEGATIVE NEGATIVE   Leukocytes, UA NEGATIVE NEGATIVE  Comprehensive metabolic panel  Result Value Ref Range  Sodium 138 135 - 145 mmol/L   Potassium 4.2 3.5 - 5.1 mmol/L   Chloride 101 101 - 111 mmol/L   CO2 29 22 - 32 mmol/L   Glucose, Bld 140 (H) 70 - 99 mg/dL   BUN 22 (H) 6 - 20 mg/dL   Creatinine, Ser 4.09 0.44 - 1.00 mg/dL   Calcium 9.8 8.9 - 81.1 mg/dL   Total Protein 8.3 (H) 6.5 - 8.1 g/dL   Albumin 3.7 3.5 - 5.0 g/dL   AST 914 (H) 15 - 41 U/L   ALT 299 (H) 14 - 54 U/L   Alkaline Phosphatase 236 (H) 38 - 126 U/L   Total Bilirubin 2.9 (H) 0.3 - 1.2 mg/dL   GFR calc non Af Amer >60 >60 mL/min   GFR calc Af Amer >60 >60 mL/min   Anion gap 8 5 - 15  Lipase, blood  Result Value Ref Range   Lipase 20 (L) 22 - 51 U/L  CBC with Differential/Platelet  Result Value Ref Range   WBC 8.2 4.0 - 10.5 K/uL   RBC 4.11 3.87 - 5.11 MIL/uL   Hemoglobin 13.2 12.0 - 15.0 g/dL   HCT 78.2 95.6 - 21.3 %   MCV 96.4 78.0 - 100.0 fL   MCH 32.1 26.0 - 34.0 pg   MCHC 33.3 30.0 - 36.0 g/dL   RDW 08.6 57.8 - 46.9 %   Platelets 108 (L) 150 - 400 K/uL   Neutrophils Relative % 90 (H) 43 - 77 %   Lymphocytes Relative 4 (L) 12 - 46 %   Monocytes Relative 6 3 - 12 %   Eosinophils Relative 0 0 - 5 %   Basophils Relative 0 0 - 1 %   Neutro Abs 7.4 1.7 - 7.7 K/uL   Lymphs Abs 0.3 (L) 0.7 - 4.0 K/uL   Monocytes Absolute 0.5 0.1 - 1.0 K/uL   Eosinophils Absolute 0.0 0.0 - 0.7 K/uL   Basophils Absolute 0.0 0.0 -  0.1 K/uL  I-Stat CG4 Lactic Acid, ED  Result Value Ref Range   Lactic Acid, Venous 2.21 (HH) 0.5 - 2.0 mmol/L  I-Stat CG4 Lactic Acid, ED  Result Value Ref Range   Lactic Acid, Venous 1.20 0.5 - 2.0 mmol/L     Imaging Review Ct Head Wo Contrast  10/11/2014   CLINICAL DATA:  Altered mental status, less responsive  EXAM: CT HEAD WITHOUT CONTRAST  TECHNIQUE: Contiguous axial images were obtained from the base of the skull through the vertex without intravenous contrast.  COMPARISON:  04/29/2014  FINDINGS: There is no evidence of mass effect, midline shift, or extra-axial fluid collections. There is no evidence of a space-occupying lesion or intracranial hemorrhage. There is no evidence of a cortical-based area of acute infarction. There are bilateral small old basal ganglia lacunar infarcts. There is generalized cerebral atrophy. There is periventricular white matter low attenuation likely secondary to microangiopathy.  The ventricles and sulci are appropriate for the patient's age. The basal cisterns are patent.  Visualized portions of the orbits are unremarkable. The visualized portions of the paranasal sinuses and mastoid air cells are unremarkable.  The osseous structures are unremarkable.  IMPRESSION: 1. No acute intracranial pathology. 2. Chronic microvascular disease and cerebral atrophy.   Electronically Signed   By: Elige Ko   On: 10/11/2014 18:58   Ct Abdomen Pelvis W Contrast  10/11/2014   CLINICAL DATA:  Altered mental status, confusion, fever  EXAM: CT ABDOMEN AND PELVIS WITH CONTRAST  TECHNIQUE:  Multidetector CT imaging of the abdomen and pelvis was performed using the standard protocol following bolus administration of intravenous contrast.  CONTRAST:  OMNIPAQUE IOHEXOL 300 MG/ML  SOLN  COMPARISON:  None.  FINDINGS: Lower chest:  No significant abnormality  Hepatobiliary: There are calculi within the gallbladder lumen. Otherwise unremarkable appearances of the gallbladder and  bile ducts. The liver appears normal.  Pancreas: Normal  Spleen: Normal  Adrenals/Urinary Tract: The adrenals and kidneys are normal in appearance. There is no urinary calculus evident. There is no hydronephrosis or ureteral dilatation. Collecting systems and ureters appear unremarkable.  Stomach/Bowel: The stomach, small bowel and colon are remarkable only for mild uncomplicated colonic diverticulosis and large colonic stool volume.  Vascular/Lymphatic: The abdominal aorta is normal in caliber. There is mild atherosclerotic calcification. There is no adenopathy in the abdomen or pelvis.  Reproductive: There is hysterectomy. There is a very small volume free fluid in the right adnexal region.  Other: No acute inflammatory changes are evident in the abdomen or pelvis.  Musculoskeletal: No significant musculoskeletal abnormalities are evident.  IMPRESSION: No acute findings are evident. There is cholelithiasis. There is mild colonic diverticulosis. There is large colonic stool volume. There is a very small volume free fluid in the right adnexal region.   Electronically Signed   By: Ellery Plunk M.D.   On: 10/11/2014 22:14   Dg Chest Port 1 View  10/11/2014   CLINICAL DATA:  Altered mental status.  Fever.  EXAM: PORTABLE CHEST - 1 VIEW  COMPARISON:  None.  FINDINGS: The heart size and mediastinal contours are within normal limits. Mild bilateral apical scarring is noted. No acute pulmonary disease is noted. No pneumothorax or pleural effusion is noted. The visualized skeletal structures are unremarkable.  IMPRESSION: No acute cardiopulmonary abnormality seen.   Electronically Signed   By: Lupita Raider, M.D.   On: 10/11/2014 18:10     EKG Interpretation   Date/Time:  Wednesday Oct 11 2014 19:23:35 EDT Ventricular Rate:  113 PR Interval:  179 QRS Duration: 95 QT Interval:  328 QTC Calculation: 450 R Axis:   20 Text Interpretation:  Sinus tachycardia Abnormal R-wave progression, early  transition  No significant change since last tracing Tachycardia is new  Confirmed by Kinzleigh Kandler  MD, Elliona Doddridge [phone]) on 10/11/2014 7:27:12 PM      CRITICAL CARE Performed by: Vanetta Mulders Total critical care time: 30 Critical care time was exclusive of separately billable procedures and treating other patients. Critical care was necessary to treat or prevent imminent or life-threatening deterioration. Critical care was time spent personally by me on the following activities: development of treatment plan with patient and/or surrogate as well as nursing, discussions with consultants, evaluation of patient's response to treatment, examination of patient, obtaining history from patient or surrogate, ordering and performing treatments and interventions, ordering and review of laboratory studies, ordering and review of radiographic studies, pulse oximetry and re-evaluation of patient's condition.      MDM   Final diagnoses:  Altered mental status  Altered mental status  Abdominal pain    Patient presented with altered mental status from a nursing facility in [place]. Brought in by EMS. Patient normally ambulates and is usually able to hold on some conversation. But does have a history of dementia. Today noted by staff and family members to be very altered mentally not able to communicate not wanting to eat or drink not walking.  Workup here initial presentation showed temperature of 99 no true fever  with low-grade fever. Persistent tachycardia with heart rate around 110. No hypoxia no hypotension. Patient's only complaint as per family seemed to be discomfort in the lower part of the abdomen. Was considered that patient may have a urinary tract infection and perhaps early urosepsis.  Patient was treated as if it was early sepsis. Based on the protocol. Patient though was given judicial boluses of the normal saline and 500 increments but did end up getting 1.5 L.  Patient's lactic acid was  originally elevated but after the fluids like to guess it is now normal. Chest x-ray was negative for pneumonia urinalysis is negative for urinary tract infection. Abdominal films showed significant liver function test abnormalities including elevation in bilirubin to 2.9. Patient has no significant tenderness in the right upper quadrant. Based on these abnormalities CT scan of the abdomen and pelvis was ordered.  Showed evidence of cholelithiasis but no evidence of any liver or biliary duct abnormalities and no thickening of the gallbladder to be suggestive of cholecystitis. Lipase was not elevated so not likely pancreatitis. Did show some constipation. Patient did not have a leukocytosis or was no significant anemia. Kidney function was normal. Head CT was negative. Ammonia levels pending.  Patient did receive antibiotics pain saw on the sepsis protocol. Discussed with the hospitalist patient will be admitted.  Vanetta Mulders, MD 10/12/14 [phone]

## 2014-10-12 NOTE — Clinical Social Work Note (Signed)
Clinical Social Work Assessment  Patient Details  Name: [name] Reynolds MRN: [id number] Date of Birth: 03-30-1929  Date of referral:  10/12/14               Reason for consult:  Facility Placement                Permission sought to share information with:    Permission granted to share information::     Name::        Agency::     Relationship::     Contact Information:     Housing/Transportation Living arrangements for the past 2 months:    Source of Information:  Other (Comment Required) (Sister, Ernest Haber and Elroy Channel, Information systems manager) Patient Interpreter Needed:  None Criminal Activity/Legal Involvement Pertinent to Current Situation/Hospitalization:  No - Comment as needed Significant Relationships:  Siblings Lives with:  Facility Resident Do you feel safe going back to the place where you live?  Yes Need for family participation in patient care:  Yes (Comment) (Family needs to continue visiting patient as they have been.)  Care giving concerns:  Patient is in a facility and they provide care.   Social Worker assessment / plan:  CSW met with patient who was not oriented and her verbalizations were not understandable. CSW spoke to patient's sister, Cornell Barman.  She advised that patient has been at [facility] for the past years.  She stated that at baseline, patient walks unassisted or staff will help her.  Ms. Ferol Luz indicated that staff assist patient with ADL's.  She stated that patient has "always had a child's mind." Ms. Ferol Luz indicated that she desires for patient to return to the facility upon discharge.  She stated that she visits with patient weekly.  CSW spoke with Elza Rafter, Scientist, physiological, at [facility].  She confirmed Ms. Mobley's statements.  She indicated that patient could return to the facility upon discharge.      Employment status:  Disabled (Comment on whether or not currently receiving Disability) Insurance information:   Medicare, Medicaid [place] PT Recommendations:  Not assessed at this time Information / Referral to community resources:     Patient/Family's Response to care:  Patient's sister has been at patient's bedside earlier this morning.  She is involved in patient's care.  Patient/Family's Understanding of and Emotional Response to Diagnosis, Current Treatment, and Prognosis:  Patient's sister understands that due to patient's cognitive limitations she will continue to need placement for ongoing care needs.   Emotional Assessment Appearance:  Developmentally appropriate Attitude/Demeanor/Rapport:  Unable to Assess Affect (typically observed):  Unable to Assess Orientation:    Alcohol / Substance use:  Never Used Psych involvement (Current and /or in the community):  No (Comment)  Discharge Needs  Concerns to be addressed:  Discharge Planning Concerns Readmission within the last 30 days:  No Current discharge risk:  None Barriers to Discharge:  No Barriers Identified   Ihor Gully, [name] 10/12/2014, 11:01 AM

## 2014-10-12 NOTE — H&P (Signed)
Hospitalist Admission History and Physical  Patient name: [name] Reynolds Medical record number: [id number] Date of birth: September 12, 1928 Age: 81 y.o. Gender: female  Primary Care Provider: [facility]  Chief Complaint: encephalopathy, transaminitis   History of Present Illness:This is a 81 y.o. year old female with significant past medical history of MR, schizoaffective disorder, dementia, HTN, seizures presenting with encephalopathy, transaminitis. Pt resident of local care home. Per sister, pt w/ decreased mentation and decreased appetite, which is off of baseline. Per report, pt usually verbal, though nonsensical. No fevers or chills. ? abd pain today. No reports of nausea, vomiting, diarrhea. No recent medication changes. Tmax 101 at care facility per sister.  Presented to the ER Tmax 99.1, HR in 100s, resp 10s, BP 120s-160s, satting 100% on RA. CBC and BMET grossly WNL. AST 335, ALT 299, ALP 236, Tbili 2.9. Lactate 2.2-1.6. Head CT and CXR WNL. CT abd and pelvis negative for any acute findings. + cholelithiasis w/ obstruction or biliary tract dilatation. Small free fluid in R adnexal region. Started on vanc and zosyn empirically as pt met sepsis criteria on admission.    Assessment and Plan:  Active Problems:   Sepsis   Encephalopathy   Transaminitis   1- sepsis  -meets criteria based on HR, resp rate, transient T >100 -unknown etiology though intra-abdominal may be a potential source-though exam and imaging benign -IV vanc and zosyn  -pan culture -IVFs  -follow  2- Encephalopathy -multifactorial- toxic-metabolic vs. Infectious -ammonia level pending -head CT WNL  -treat as above  -follow  3- transaminitis -unclear etiology -CT imaging w cholelithiasis, though no active disease -benign exam  -serial hepatic function -hepatitis panel  -tylenol level  -follow   -NPO overnight  FEN/GI: NPO for now  Prophylaxis: sub q heparin  Disposition:  pending further evaluation  Code Status:limited code- CPR only    Patient Active Problem List   Diagnosis Date Noted  . Sepsis 10/12/2014   Past Medical History: Past Medical History  Diagnosis Date  . HTN (hypertension)   . Hyperlipemia   . Seizures   . GERD (gastroesophageal reflux disease)   . Alzheimer's dementia   . Insomnia   . Vitamin D deficiency   . Schizoaffective disorder     Past Surgical History: Past Surgical History  Procedure Laterality Date  . Stomach surgery      Social History: History   Social History  . Marital Status: Widowed    Spouse Name: N/A  . Number of Children: N/A  . Years of Education: N/A   Social History Main Topics  . Smoking status: Never Smoker   . Smokeless tobacco: Not on file  . Alcohol Use: No  . Drug Use: No  . Sexual Activity: No   Other Topics Concern  . None   Social History Narrative    Family History: History reviewed. No pertinent family history.  Allergies: No Known Allergies  Current Facility-Administered Medications  Medication Dose Route Frequency Provider Last Rate Last Dose  . 0.9 %  sodium chloride infusion   Intravenous Continuous Fredia Sorrow, MD      . 0.9 %  sodium chloride infusion   Intravenous Continuous Deneise Lever, MD      . heparin injection 5,000 Units  5,000 Units Subcutaneous 3 times per day Deneise Lever, MD      . piperacillin-tazobactam (ZOSYN) IVPB 3.375 g  3.375 g Intravenous Q8H Thomes Lolling, RPH      .  piperacillin-tazobactam (ZOSYN) IVPB 3.375 g  3.375 g Intravenous Once Deneise Lever, MD      . vancomycin (VANCOCIN) IVPB 1000 mg/200 mL premix  1,000 mg Intravenous Once Deneise Lever, MD      . vancomycin (VANCOCIN) IVPB 750 mg/150 ml premix  750 mg Intravenous Q24H Thomes Lolling, [facility]       Current Outpatient Prescriptions  Medication Sig Dispense Refill  . aspirin EC 81 MG tablet Take 81 mg by mouth daily.    . benztropine (COGENTIN) 0.5 MG tablet  Take 0.5 mg by mouth 2 (two) times daily.    . cholecalciferol (VITAMIN D) 1000 UNITS tablet Take 1,000 Units by mouth daily.    . clonazePAM (KLONOPIN) 0.5 MG tablet Take 0.5 mg by mouth 2 (two) times daily.    . famotidine (PEPCID) 20 MG tablet Take 20 mg by mouth at bedtime.    . hydrOXYzine (VISTARIL) 25 MG capsule Take 25 mg by mouth at bedtime.    Marland Kitchen lactulose, encephalopathy, (CHRONULAC) 10 GM/15ML SOLN Take 10 g by mouth daily.    . polyethylene glycol powder (GLYCOLAX/MIRALAX) powder Take 17 g by mouth daily as needed for mild constipation or moderate constipation (*Mix in 8 oz of water and drink as needed to have a bowel movement every 3 days.).    Marland Kitchen simvastatin (ZOCOR) 40 MG tablet Take 40 mg by mouth at bedtime.    . traZODone (DESYREL) 100 MG tablet Take 200 mg by mouth at bedtime.    . ziprasidone (GEODON) 40 MG capsule Take 40 mg by mouth 2 (two) times daily with a meal.     Review Of Systems: 12 point ROS negative except as noted above in HPI.  Physical Exam: Filed Vitals:   10/12/14 0015  BP: 130/76  Pulse: 108  Temp:   Resp: 18    General: cachectic, confused  HEENT: PERRLA, extra ocular movement intact and dry oral mucosa Heart: S1, S2 normal, no murmur, rub or gallop, regular rate and rhythm Lungs: clear to auscultation, no wheezes or rales and unlabored breathing Abdomen: abdomen is soft without significant tenderness, masses, organomegaly or guarding Extremities: extremities normal, atraumatic, no cyanosis or edema Skin:no rashes Neurology: minimally cooperative to exam   Labs and Imaging: Lab Results  Component Value Date/Time   NA 138 10/11/2014 06:15 PM   K 4.2 10/11/2014 06:15 PM   CL 101 10/11/2014 06:15 PM   CO2 29 10/11/2014 06:15 PM   BUN 22* 10/11/2014 06:15 PM   CREATININE 0.77 10/11/2014 06:15 PM   GLUCOSE 140* 10/11/2014 06:15 PM   Lab Results  Component Value Date   WBC 8.2 10/11/2014   HGB 13.2 10/11/2014   HCT 39.6 10/11/2014   MCV  96.4 10/11/2014   PLT 108* 10/11/2014   Urinalysis    Component Value Date/Time   COLORURINE AMBER* 10/11/2014 2006   APPEARANCEUR CLEAR 10/11/2014 2006   LABSPEC >1.030* 10/11/2014 2006   PHURINE 5.5 10/11/2014 2006   GLUCOSEU 100* 10/11/2014 2006   HGBUR NEGATIVE 10/11/2014 2006   BILIRUBINUR MODERATE* 10/11/2014 2006   Vonore NEGATIVE 10/11/2014 2006   PROTEINUR NEGATIVE 10/11/2014 2006   UROBILINOGEN >8.0* 10/11/2014 2006   NITRITE NEGATIVE 10/11/2014 2006   LEUKOCYTESUR NEGATIVE 10/11/2014 2006       Ct Head Wo Contrast  10/11/2014   CLINICAL DATA:  Altered mental status, less responsive  EXAM: CT HEAD WITHOUT CONTRAST  TECHNIQUE: Contiguous axial images were obtained from the base of the  skull through the vertex without intravenous contrast.  COMPARISON:  04/29/2014  FINDINGS: There is no evidence of mass effect, midline shift, or extra-axial fluid collections. There is no evidence of a space-occupying lesion or intracranial hemorrhage. There is no evidence of a cortical-based area of acute infarction. There are bilateral small old basal ganglia lacunar infarcts. There is generalized cerebral atrophy. There is periventricular white matter low attenuation likely secondary to microangiopathy.  The ventricles and sulci are appropriate for the patient's age. The basal cisterns are patent.  Visualized portions of the orbits are unremarkable. The visualized portions of the paranasal sinuses and mastoid air cells are unremarkable.  The osseous structures are unremarkable.  IMPRESSION: 1. No acute intracranial pathology. 2. Chronic microvascular disease and cerebral atrophy.   Electronically Signed   By: Kathreen Devoid   On: 10/11/2014 18:58   Ct Abdomen Pelvis W Contrast  10/11/2014   CLINICAL DATA:  Altered mental status, confusion, fever  EXAM: CT ABDOMEN AND PELVIS WITH CONTRAST  TECHNIQUE: Multidetector CT imaging of the abdomen and pelvis was performed using the standard protocol  following bolus administration of intravenous contrast.  CONTRAST:  165m OMNIPAQUE IOHEXOL 300 MG/ML  SOLN  COMPARISON:  None.  FINDINGS: Lower chest:  No significant abnormality  Hepatobiliary: There are calculi within the gallbladder lumen. Otherwise unremarkable appearances of the gallbladder and bile ducts. The liver appears normal.  Pancreas: Normal  Spleen: Normal  Adrenals/Urinary Tract: The adrenals and kidneys are normal in appearance. There is no urinary calculus evident. There is no hydronephrosis or ureteral dilatation. Collecting systems and ureters appear unremarkable.  Stomach/Bowel: The stomach, small bowel and colon are remarkable only for mild uncomplicated colonic diverticulosis and large colonic stool volume.  Vascular/Lymphatic: The abdominal aorta is normal in caliber. There is mild atherosclerotic calcification. There is no adenopathy in the abdomen or pelvis.  Reproductive: There is hysterectomy. There is a very small volume free fluid in the right adnexal region.  Other: No acute inflammatory changes are evident in the abdomen or pelvis.  Musculoskeletal: No significant musculoskeletal abnormalities are evident.  IMPRESSION: No acute findings are evident. There is cholelithiasis. There is mild colonic diverticulosis. There is large colonic stool volume. There is a very small volume free fluid in the right adnexal region.   Electronically Signed   By: DAndreas NewportM.D.   On: 10/11/2014 22:14   Dg Chest Port 1 View  10/11/2014   CLINICAL DATA:  Altered mental status.  Fever.  EXAM: PORTABLE CHEST - 1 VIEW  COMPARISON:  None.  FINDINGS: The heart size and mediastinal contours are within normal limits. Mild bilateral apical scarring is noted. No acute pulmonary disease is noted. No pneumothorax or pleural effusion is noted. The visualized skeletal structures are unremarkable.  IMPRESSION: No acute cardiopulmonary abnormality seen.   Electronically Signed   By: JMarijo Conception M.D.    On: 10/11/2014 18:10           SShanda HowellsMD  Pager: [phone]

## 2014-10-12 NOTE — Progress Notes (Signed)
[facility] PROGRESS NOTE  Malachy Chamberrene Mehra ZOX:[id number] DOB: 09-12-1928 DOA: 10/11/2014 [facility]  Assessment/Plan: 1. Failure to thrive/functional decline. -I suspect secondary to dehydration and possibly underlying infectious process although workup unremarkable. Head CT negative, chest x-ray did not reveal acute cardiopulmonary abnormality, urinalysis clear. -We'll continue one more day of empiric IV antimicrobial therapy and follow-up on blood cultures. If they remain negative by tomorrow we will discontinue antibiotics and watch her off of antimicrobial therapy. She has been afebrile since admission.  2.  SIRS -Patient presenting with acute encephalopathy, vital signs showing heart rate of 1:15 with respiratory rate of 31, without obvious source of infection. -As mentioned above will continue one more day of IV antimicrobial therapy and follow-up on blood cultures in a.m.  3.  Elevated transaminases. -Unclear etiology could be related to underlying SIRS. Liver enzymes improving on this mornings lab work. She was initially worked up with a CT scan of abdomen and pelvis which did not reveal acute intra-abdominal abnormality. This was further worked up with right upper quadrant ultrasound which showed a single gallstone without evidence for acute cholecystitis. Liver had normal appearance. On exam she did not have pain to palpation over right upper quadrant.    Code Status: Partial Family Communication: I spoke with her sister at bedside Disposition Plan: Anticipate discharge back to her group home when medically stable    Antibiotics:  Vancomycin  Zosyn  HPI/Subjective: Patient is a pleasant 81 year old female with a past medical history of MR, dementia, admitted to medicine service on 10/12/2014 which she presented with a steep functional decline. She was noted by staff members at her group home to be less interactive, having minimal by mouth  intake, becoming nonverbal and less active. Initial workup included a CT scan of brain that did not reveal acute intracranial findings. Lab work did reveal the presence of elevated transaminases and was further workup with a CT scan of abdomen and pelvis which did not show acute intra-abdominal pathology. Repeat CMP in a.m. showed downward trend in liver enzymes. Right upper quadrant ultrasound revealed the presence of a gallstone without evidence of acute cholecystitis. On exam she did not have pain to palpation over right upper quadrant.   Objective: Filed Vitals:   10/12/14 1521  BP: 101/47  Pulse: 94  Temp: 97.5 F (36.4 C)  Resp: 18    Intake/Output Summary (Last 24 hours) at 10/12/14 1815 Last data filed at 10/12/14 1756  Gross per 24 hour  Intake 1110.42 ml  Output      0 ml  Net 1110.42 ml   Filed Weights   10/12/14 0230  Weight: 48.9 kg (107 lb 12.9 oz)    Exam:   General:  Patient is awake and alert, she had to lay down the hallway seems improved this morning.  Cardiovascular: Regular rate and rhythm normal S1-S2  Respiratory: Normal S1S2, tachycardic  Abdomen: Soft, nontender nondistended, did not have pain with palpation  Musculoskeletal: No edema  Data Reviewed: Basic Metabolic Panel:  Recent Labs Lab 10/11/14 1815 10/12/14 0759  NA 138 141  K 4.2 3.7  CL 101 109  CO2 29 27  GLUCOSE 140* 92  BUN 22* 16  CREATININE 0.77 0.60  CALCIUM 9.8 9.0   Liver Function Tests:  Recent Labs Lab 10/11/14 1815 10/12/14 0759  AST 335* 156*  ALT 299* 195*  ALKPHOS 236* 174*  BILITOT 2.9* 2.4*  PROT 8.3* 7.1  ALBUMIN 3.7 3.1*  Recent Labs Lab 10/11/14 1815  LIPASE 20*    Recent Labs Lab 10/12/14 0013  AMMONIA 28   CBC:  Recent Labs Lab 10/11/14 1815 10/12/14 0759  WBC 8.2 10.5  NEUTROABS 7.4 9.0*  HGB 13.2 13.1  HCT 39.6 39.9  MCV 96.4 96.6  PLT 108* 98*   Cardiac Enzymes: No results for input(s): CKTOTAL, CKMB, CKMBINDEX,  TROPONINI in the last 168 hours. BNP (last 3 results) No results for input(s): BNP in the last 8760 hours.  ProBNP (last 3 results) No results for input(s): PROBNP in the last 8760 hours.  CBG: No results for input(s): GLUCAP in the last 168 hours.  Recent Results (from the past 240 hour(s))  Culture, blood (routine x 2)     Status: None (Preliminary result)   Collection Time: 10/11/14  8:50 PM  Result Value Ref Range Status   Specimen Description BLOOD RIGHT HAND  Final   Special Requests BOTTLES DRAWN AEROBIC ONLY 6CC  Final   Culture NO GROWTH 1 DAY  Final   Report Status PENDING  Incomplete  Culture, blood (routine x 2)     Status: None (Preliminary result)   Collection Time: 10/11/14  8:55 PM  Result Value Ref Range Status   Specimen Description BLOOD RIGHT HAND  Final   Special Requests BOTTLES DRAWN AEROBIC AND ANAEROBIC 6CC  Final   Culture NO GROWTH 1 DAY  Final   Report Status PENDING  Incomplete     Studies: Ct Head Wo Contrast  10/11/2014   CLINICAL DATA:  Altered mental status, less responsive  EXAM: CT HEAD WITHOUT CONTRAST  TECHNIQUE: Contiguous axial images were obtained from the base of the skull through the vertex without intravenous contrast.  COMPARISON:  04/29/2014  FINDINGS: There is no evidence of mass effect, midline shift, or extra-axial fluid collections. There is no evidence of a space-occupying lesion or intracranial hemorrhage. There is no evidence of a cortical-based area of acute infarction. There are bilateral small old basal ganglia lacunar infarcts. There is generalized cerebral atrophy. There is periventricular white matter low attenuation likely secondary to microangiopathy.  The ventricles and sulci are appropriate for the patient's age. The basal cisterns are patent.  Visualized portions of the orbits are unremarkable. The visualized portions of the paranasal sinuses and mastoid air cells are unremarkable.  The osseous structures are unremarkable.   IMPRESSION: 1. No acute intracranial pathology. 2. Chronic microvascular disease and cerebral atrophy.   Electronically Signed   By: Elige KoHetal  Patel   On: 10/11/2014 18:58   Ct Abdomen Pelvis W Contrast  10/11/2014   CLINICAL DATA:  Altered mental status, confusion, fever  EXAM: CT ABDOMEN AND PELVIS WITH CONTRAST  TECHNIQUE: Multidetector CT imaging of the abdomen and pelvis was performed using the standard protocol following bolus administration of intravenous contrast.  CONTRAST:  100mL OMNIPAQUE IOHEXOL 300 MG/ML  SOLN  COMPARISON:  None.  FINDINGS: Lower chest:  No significant abnormality  Hepatobiliary: There are calculi within the gallbladder lumen. Otherwise unremarkable appearances of the gallbladder and bile ducts. The liver appears normal.  Pancreas: Normal  Spleen: Normal  Adrenals/Urinary Tract: The adrenals and kidneys are normal in appearance. There is no urinary calculus evident. There is no hydronephrosis or ureteral dilatation. Collecting systems and ureters appear unremarkable.  Stomach/Bowel: The stomach, small bowel and colon are remarkable only for mild uncomplicated colonic diverticulosis and large colonic stool volume.  Vascular/Lymphatic: The abdominal aorta is normal in caliber. There is mild atherosclerotic calcification. There  is no adenopathy in the abdomen or pelvis.  Reproductive: There is hysterectomy. There is a very small volume free fluid in the right adnexal region.  Other: No acute inflammatory changes are evident in the abdomen or pelvis.  Musculoskeletal: No significant musculoskeletal abnormalities are evident.  IMPRESSION: No acute findings are evident. There is cholelithiasis. There is mild colonic diverticulosis. There is large colonic stool volume. There is a very small volume free fluid in the right adnexal region.   Electronically Signed   By: Ellery Plunk M.D.   On: 10/11/2014 22:14   Dg Chest Port 1 View  10/11/2014   CLINICAL DATA:  Altered mental status.   Fever.  EXAM: PORTABLE CHEST - 1 VIEW  COMPARISON:  None.  FINDINGS: The heart size and mediastinal contours are within normal limits. Mild bilateral apical scarring is noted. No acute pulmonary disease is noted. No pneumothorax or pleural effusion is noted. The visualized skeletal structures are unremarkable.  IMPRESSION: No acute cardiopulmonary abnormality seen.   Electronically Signed   By: Lupita Raider, M.D.   On: 10/11/2014 18:10   US Abdomen Limited Ruq  10/12/2014   CLINICAL DATA:  Transaminitis. Altered mental status. Elevated LFTs today.  EXAM: US ABDOMEN LIMITED - RIGHT UPPER QUADRANT  COMPARISON:  CT of the abdomen and pelvis 10/11/2014  FINDINGS: Gallbladder:  Single 7 mm gallstone is present. Gallbladder wall is mildly thickened, 3.3 mm. No sonographic Murphy sign. No pericholecystic fluid.  Common bile duct:  Diameter: 5.3 mm  Liver:  Normal echotexture.  No focal liver lesions.  IMPRESSION: 1. Mild gallbladder wall thickening, nonspecific. 2. Single gallstone without other evidence for acute cholecystitis. 3. Liver has a normal appearance.   Electronically Signed   By: Norva Pavlov M.D.   On: 10/12/2014 15:15    Scheduled Meds: . feeding supplement (ENSURE ENLIVE)  237 mL Oral Q24H  . heparin  5,000 Units Subcutaneous 3 times per day  . piperacillin-tazobactam (ZOSYN)  IV  3.375 g Intravenous Q8H  . vancomycin  750 mg Intravenous Q24H   Continuous Infusions: . sodium chloride      Active Problems:   Sepsis   Encephalopathy   Transaminitis    Time spent:     Jeralyn Bennett  [facility] Pager [phone]. If 7PM-7AM, please contact night-coverage at [email or web address], password [facility] 10/12/2014, 6:15 PM

## 2014-10-12 NOTE — Care Management Note (Signed)
Case Management Note  Patient Details  Name: [name] Reynolds MRN: [id number] Date of Birth: 1928/11/18  Subjective/Objective:                  [facility]. Anticipate discharge back to facility at discharge.  Action/Plan: CSW is aware and will arrange discharge to facility when medically stable.  Expected Discharge Date:  10/14/14               Expected Discharge Plan:  Assisted Living / Rest Home  In-House Referral:  Clinical Social Work  Discharge planning Services  CM Consult  Post Acute Care Choice:    Choice offered to:     DME Arranged:    DME Agency:     HH Arranged:    HH Agency:     Status of Service:  In process, will continue to follow  Medicare Important Message Given:    Date Medicare IM Given:    Medicare IM give by:    Date Additional Medicare IM Given:    Additional Medicare Important Message give by:     If discussed at Long Length of Stay Meetings, dates discussed:    Additional Comments:  Cheryl FlashBlackwell, Chancey Ringel Crowder, RN 10/12/2014, 3:03 PM

## 2014-10-13 DIAGNOSIS — Z681 Body mass index (BMI) 19 or less, adult: Secondary | ICD-10-CM | POA: Diagnosis not present

## 2014-10-13 DIAGNOSIS — E860 Dehydration: Secondary | ICD-10-CM | POA: Diagnosis present

## 2014-10-13 DIAGNOSIS — A419 Sepsis, unspecified organism: Secondary | ICD-10-CM | POA: Diagnosis present

## 2014-10-13 DIAGNOSIS — R6510 Systemic inflammatory response syndrome (SIRS) of non-infectious origin without acute organ dysfunction: Secondary | ICD-10-CM | POA: Insufficient documentation

## 2014-10-13 DIAGNOSIS — G9340 Encephalopathy, unspecified: Secondary | ICD-10-CM | POA: Diagnosis present

## 2014-10-13 DIAGNOSIS — F0280 Dementia in other diseases classified elsewhere without behavioral disturbance: Secondary | ICD-10-CM | POA: Diagnosis present

## 2014-10-13 DIAGNOSIS — K219 Gastro-esophageal reflux disease without esophagitis: Secondary | ICD-10-CM | POA: Diagnosis present

## 2014-10-13 DIAGNOSIS — R627 Adult failure to thrive: Secondary | ICD-10-CM | POA: Diagnosis present

## 2014-10-13 DIAGNOSIS — E43 Unspecified severe protein-calorie malnutrition: Secondary | ICD-10-CM | POA: Diagnosis present

## 2014-10-13 DIAGNOSIS — B349 Viral infection, unspecified: Secondary | ICD-10-CM | POA: Diagnosis present

## 2014-10-13 DIAGNOSIS — R4182 Altered mental status, unspecified: Secondary | ICD-10-CM | POA: Diagnosis present

## 2014-10-13 DIAGNOSIS — G309 Alzheimer's disease, unspecified: Secondary | ICD-10-CM | POA: Diagnosis present

## 2014-10-13 DIAGNOSIS — I10 Essential (primary) hypertension: Secondary | ICD-10-CM | POA: Diagnosis present

## 2014-10-13 DIAGNOSIS — R740 Nonspecific elevation of levels of transaminase and lactic acid dehydrogenase [LDH]: Secondary | ICD-10-CM

## 2014-10-13 DIAGNOSIS — F259 Schizoaffective disorder, unspecified: Secondary | ICD-10-CM | POA: Diagnosis present

## 2014-10-13 DIAGNOSIS — E785 Hyperlipidemia, unspecified: Secondary | ICD-10-CM | POA: Diagnosis present

## 2014-10-13 LAB — CBC WITH DIFFERENTIAL/PLATELET
BASOS PCT: 0 % (ref 0–1)
Basophils Absolute: 0 10*3/uL (ref 0.0–0.1)
EOS ABS: 0.1 10*3/uL (ref 0.0–0.7)
Eosinophils Relative: 1 % (ref 0–5)
HEMATOCRIT: 34.3 % — AB (ref 36.0–46.0)
Hemoglobin: 11.3 g/dL — ABNORMAL LOW (ref 12.0–15.0)
LYMPHS ABS: 0.6 10*3/uL — AB (ref 0.7–4.0)
Lymphocytes Relative: 8 % — ABNORMAL LOW (ref 12–46)
MCH: 31.5 pg (ref 26.0–34.0)
MCHC: 32.9 g/dL (ref 30.0–36.0)
MCV: 95.5 fL (ref 78.0–100.0)
Monocytes Absolute: 0.8 10*3/uL (ref 0.1–1.0)
Monocytes Relative: 10 % (ref 3–12)
NEUTROS PCT: 81 % — AB (ref 43–77)
Neutro Abs: 6.2 10*3/uL (ref 1.7–7.7)
Platelets: 99 10*3/uL — ABNORMAL LOW (ref 150–400)
RBC: 3.59 MIL/uL — AB (ref 3.87–5.11)
RDW: 13.8 % (ref 11.5–15.5)
Smear Review: DECREASED
WBC: 7.7 10*3/uL (ref 4.0–10.5)

## 2014-10-13 LAB — COMPREHENSIVE METABOLIC PANEL
ALBUMIN: 2.7 g/dL — AB (ref 3.5–5.0)
ALT: 118 U/L — AB (ref 14–54)
AST: 67 U/L — ABNORMAL HIGH (ref 15–41)
Alkaline Phosphatase: 132 U/L — ABNORMAL HIGH (ref 38–126)
Anion gap: 6 (ref 5–15)
BILIRUBIN TOTAL: 0.8 mg/dL (ref 0.3–1.2)
BUN: 16 mg/dL (ref 6–20)
CHLORIDE: 107 mmol/L (ref 101–111)
CO2: 27 mmol/L (ref 22–32)
Calcium: 8.8 mg/dL — ABNORMAL LOW (ref 8.9–10.3)
Creatinine, Ser: 0.53 mg/dL (ref 0.44–1.00)
Glucose, Bld: 113 mg/dL — ABNORMAL HIGH (ref 65–99)
Potassium: 3.6 mmol/L (ref 3.5–5.1)
Sodium: 140 mmol/L (ref 135–145)
Total Protein: 6.4 g/dL — ABNORMAL LOW (ref 6.5–8.1)

## 2014-10-13 MED ORDER — AMLODIPINE BESYLATE 5 MG PO TABS
5.0000 mg | ORAL_TABLET | Freq: Every day | ORAL | Status: DC
  Filled 2014-10-13: qty 1

## 2014-10-13 MED ORDER — ENSURE ENLIVE PO LIQD: 237.0000 mL | ORAL | Status: AC

## 2014-10-13 MED ORDER — AMLODIPINE BESYLATE 5 MG PO TABS: 5.0000 mg | ORAL_TABLET | Freq: Every day | ORAL | Status: DC

## 2014-10-13 MED ORDER — AMLODIPINE BESYLATE 5 MG PO TABS: 5.0000 mg | ORAL_TABLET | Freq: Every day | ORAL | Status: AC

## 2014-10-13 NOTE — Care Management Note (Signed)
Case Management Note  Patient Details  Name: Malachy Chamberrene Pulis MRN: [id number] Date of Birth: [birth year]  Subjective/Objective:                    Action/Plan:   Expected Discharge Date:  10/14/14               Expected Discharge Plan:  Assisted Living / Rest Home  In-House Referral:  Clinical Social Work  Discharge planning Services  CM Consult  Post Acute Care Choice:  NA Choice offered to:  NA  DME Arranged:    DME Agency:     HH Arranged:    HH Agency:     Status of Service:  Completed, signed off  Medicare Important Message Given:  N/A - LOS <3 / Initial given by admissions Date Medicare IM Given:    Medicare IM give by:    Date Additional Medicare IM Given:    Additional Medicare Important Message give by:     If discussed at Long Length of Stay Meetings, dates discussed:    Additional Comments: [facility] CSW to arrange discharge to facility. No CM needs noted. Arlyss QueenBlackwell, Shaketta Rill Edgewoodrowder, RN 10/13/2014, 9:10 AM

## 2014-10-13 NOTE — Discharge Summary (Addendum)
Physician Discharge Summary  [name] Reynolds NTZ:[id number] DOB: 05-15-1929 DOA: 10/11/2014  PCP: [facility]  Admit date: 10/11/2014 Discharge date: 11/15/2014  Time spent:  minutes  Recommendations for Outpatient Follow-up:  1. Please follow up on blood pressures, on day of discharge blood pressures trending up for which she was started on Norvasc 5 mg PO q daily 2. Repeat CMP in 3-4 days, during this hospitalization she was found to have elevated transaminases  3. Please encourage PO intake, protein Boost TID  Discharge Diagnoses:  Principal Problem:   Sepsis Active Problems:   Encephalopathy   Transaminitis   FTT (failure to thrive) in adult   Dehydration   Protein-calorie malnutrition, severe   Discharge Condition: Stable  Diet recommendation: Protein Boost TID with meals, regular diet  Filed Weights   10/12/14 0230  Weight: 48.9 kg (107 lb 12.9 oz)    History of present illness:  This is a 81 y.o. year old female with significant past medical history of MR, schizoaffective disorder, dementia, HTN, seizures presenting with encephalopathy, transaminitis. Pt resident of local care home. Per sister, pt w/ decreased mentation and decreased appetite, which is off of baseline. Per report, pt usually verbal, though nonsensical. No fevers or chills. ? abd pain today. No reports of nausea, vomiting, diarrhea. No recent medication changes. Tmax 101 at care facility per sister.  Presented to the ER Tmax 99.1, HR in 100s, resp 10s, BP 120s-160s, satting 100% on RA. CBC and BMET grossly WNL. AST 335, ALT 299, ALP 236, Tbili 2.9. Lactate 2.2-1.6. Head CT and CXR WNL. CT abd and pelvis negative for any acute findings. + cholelithiasis w/ obstruction or biliary tract dilatation. Small free fluid in R adnexal region. Started on vanc and zosyn empirically as pt met sepsis criteria on admission.   Hospital Course:  Patient is a pleasant 81 year old female with a past  medical history of MR, dementia, admitted to medicine service on 10/12/2014 which she presented with a steep functional decline. She was noted by staff members at her group home to be less interactive, having minimal by mouth intake, becoming nonverbal and less active. Initial workup included a CT scan of brain that did not reveal acute intracranial findings. Lab work did reveal the presence of elevated transaminases and was further workup with a CT scan of abdomen and pelvis which did not show acute intra-abdominal pathology. Repeat CMP in a.m. showed downward trend in liver enzymes. Right upper quadrant ultrasound revealed the presence of a gallstone without evidence of acute cholecystitis. On exam she did not have pain to palpation over right upper quadrant. Her abdomen remained benign during this hospitalization. On the day of discharge transaminases continue to trend down with AST of 67, ALT of 118, alk phosphatase of 132. She was ambulated around the hallway, was tolerating by mouth intake, her sister present at bedside reported patient was functioning at her baseline level. Her IV antibiotic therapy was discontinued as she remained afebrile during this hospitalization, blood cultures remain negative, lab work did not reveal source of infection.  Given patient's market clinical improvement she was discharged to her group home on 10/13/2014 in stable condition. Patient likely had sepsis evidence by HR of 105, RR of 29.   Procedures:  Right upper quadrant ultrasound performed on 10/12/2014 IMPRESSION: 1. Mild gallbladder wall thickening, nonspecific. 2. Single gallstone without other evidence for acute cholecystitis. 3. Liver has a normal appearance.  Discharge Exam: Filed Vitals:   10/13/14 6759  BP: 171/97  Pulse: 102  Temp: 98.5 F (36.9 C)  Resp: 18    General: Patient is awake, alert, she ambulated around the nurse's station, sister who is present at bedside reporting this is her  baseline Cardiovascular: Regular rate and rhythm, tachycardic, normal S1-S2 no murmurs rubs or gallops no external edema Respiratory: Normal respiratory effort, lungs clear to auscultation Abdomen: Soft nontender nondistended, no right upper quadrant pain extremities. Bilateral muscle atrophy, no edema   Discharge Instructions   Discharge Instructions    Call MD for:  difficulty breathing, headache or visual disturbances    Complete by:  As directed      Call MD for:  redness, tenderness, or signs of infection (pain, swelling, redness, odor or green/yellow discharge around incision site)    Complete by:  As directed      Call MD for:  severe uncontrolled pain    Complete by:  As directed      Call MD for:  temperature >100.4    Complete by:  As directed      Diet - low sodium heart healthy    Complete by:  As directed      Increase activity slowly    Complete by:  As directed           Discharge Medication List as of 10/13/2014 11:35 AM    START taking these medications   Details  amLODipine (NORVASC) 5 MG tablet Take 1 tablet (5 mg total) by mouth daily., Starting 10/13/2014, Until Discontinued, Print    feeding supplement, ENSURE ENLIVE, (ENSURE ENLIVE) LIQD Take 237 mLs by mouth daily., Starting 10/13/2014, Until Discontinued, Normal      CONTINUE these medications which have NOT CHANGED   Details  aspirin EC 81 MG tablet Take 81 mg by mouth daily., Until Discontinued, Historical Med    benztropine (COGENTIN) 0.5 MG tablet Take 0.5 mg by mouth 2 (two) times daily., Until Discontinued, Historical Med    cholecalciferol (VITAMIN D) 1000 UNITS tablet Take 1,000 Units by mouth daily., Until Discontinued, Historical Med    clonazePAM (KLONOPIN) 0.5 MG tablet Take 0.5 mg by mouth 2 (two) times daily., Until Discontinued, Historical Med    famotidine (PEPCID) 20 MG tablet Take 20 mg by mouth at bedtime., Until Discontinued, Historical Med    hydrOXYzine (VISTARIL) 25 MG capsule  Take 25 mg by mouth at bedtime., Until Discontinued, Historical Med    polyethylene glycol powder (GLYCOLAX/MIRALAX) powder Take 17 g by mouth daily as needed for mild constipation or moderate constipation (*Mix in 8 oz of water and drink as needed to have a bowel movement every 3 days.)., Until Discontinued, Historical Med    simvastatin (ZOCOR) 40 MG tablet Take 40 mg by mouth at bedtime., Until Discontinued, Historical Med    traZODone (DESYREL) 100 MG tablet Take 200 mg by mouth at bedtime., Until Discontinued, Historical Med    ziprasidone (GEODON) 40 MG capsule Take 40 mg by mouth 2 (two) times daily with a meal., Until Discontinued, Historical Med      STOP taking these medications     lactulose, encephalopathy, (CHRONULAC) 10 GM/15ML SOLN        No Known Allergies Follow-up Information    Follow up with [facility] On 10/18/2014.   Why:  at 10:00 am   Contact information:   [phone]       The results of significant diagnostics from this hospitalization (including imaging, microbiology, ancillary and laboratory) are listed below for  reference.    Significant Diagnostic Studies: Ct Abdomen Pelvis W Contrast  11/13/2014   CLINICAL DATA:  Acute onset of fever and vomiting. Initial encounter.  EXAM: CT ABDOMEN AND PELVIS WITH CONTRAST  TECHNIQUE: Multidetector CT imaging of the abdomen and pelvis was performed using the standard protocol following bolus administration of intravenous contrast.  CONTRAST:  155m OMNIPAQUE IOHEXOL 300 MG/ML  SOLN  COMPARISON:  CT of the abdomen and pelvis from 10/11/2014  FINDINGS: Mild focal right basilar airspace opacity raises concern for pneumonia.  There is suggestion of mild intrahepatic biliary ductal dilatation; this is only minimally more prominent than on the prior study. The common bile duct measures up to 1.0 cm in diameter, mildly dilated for age. Trace pericholecystic fluid is noted. This could reflect distal obstruction. The  liver and spleen are otherwise unremarkable. The pancreas is grossly unremarkable in appearance. The adrenal glands are grossly unremarkable.  A small 7 mm cyst is noted at the upper pole of the right kidney. The kidneys are otherwise unremarkable. There is no evidence of hydronephrosis. No renal or ureteral stones are seen. No perinephric stranding is appreciated.  No free fluid is identified. The small bowel is unremarkable in appearance. The stomach is within normal limits. No acute vascular abnormalities are seen. Scattered calcification is noted along the abdominal aorta and its branches. Mild surrounding retroperitoneal edema is noted, of uncertain significance, with associated nodes measuring up to 8 mm in short axis. There is an unusual collection of calcified nodes within the mesentery at the left mid abdomen, possibly reflecting remote granulomatous disease, unchanged from the recent prior study.  The appendix is not definitely characterized; there is no evidence for appendicitis. Contrast progresses to the level of the proximal transverse colon. The colon is largely filled with stool, and the rectum is distended to 9.4 cm in transverse dimension with stool, raising concern for constipation and mild fecal impaction.  The bladder is mildly distended and grossly unremarkable. The patient is status post hysterectomy. No suspicious adnexal masses are seen. No inguinal lymphadenopathy is seen.  No acute osseous abnormalities are identified. Facet disease is noted along the lumbar spine.  IMPRESSION: 1. Mild focal right basilar airspace opacity raises concern for pneumonia. 2. Suggestion of mild intrahepatic biliary ductal dilatation, only minimally more prominent than on the prior study. Common bile duct measures up to 1.0 cm in diameter, mildly dilated for age. Trace pericholecystic fluid noted. This could reflect mild intermittent distal obstruction, though the patient's bilirubin has decreased since May. If  the patient's symptoms persist, MRCP or ERCP could be considered for further evaluation. 3. Colon largely filled with stool. Rectum distended to 9.4 cm in transverse dimension with stool, raising concern for constipation and mild fecal impaction. 4. Mild retroperitoneal edema is somewhat more prominent than on the prior study and of uncertain significance, with associated nodes measuring up to 8 mm in short axis, relatively stable in size. 5. Unusual collection of calcified nodes within the mesentery at the left mid abdomen, possibly reflecting remote granulomatous disease. Sequelae of prior tuberculosis infection could have a similar appearance; would correlate with the patient's history. 6. Scattered calcification along the abdominal aorta and its branches. 7. Small right renal cyst noted.   Electronically Signed   By: JGarald BaldingM.D.   On: 11/13/2014 21:50   Mr 3d Recon At Scanner  11/14/2014   CLINICAL DATA:  Sepsis with hepatic failure. Patient is confused and could not follow peripheral commands.  EXAM: MRI ABDOMEN WITHOUT AND WITH CONTRAST (INCLUDING MRCP)  TECHNIQUE: Multiplanar multisequence MR imaging of the abdomen was performed both before and after the administration of intravenous contrast. Heavily T2-weighted images of the biliary and pancreatic ducts were obtained, and three-dimensional MRCP images were rendered by post processing.  CONTRAST:  63m MULTIHANCE GADOBENATE DIMEGLUMINE 529 MG/ML IV SOLN  COMPARISON:  CT 11/13/2014  FINDINGS: Lower chest:  Small bilateral pleural effusions.  Hepatobiliary: No intrahepatic biliary duct dilatation. The MRCP sequences are severely degraded by patient respiratory motion. Patient was confused and could not follow voice commands which is required for MRI imaging. No gross biliary abnormality.  Pancreas: Normal pancreatic parenchymal intensity. No ductal dilatation or inflammation.  Spleen: Normal spleen.  Adrenals/urinary tract: Adrenal glands and  kidneys are normal.  Stomach/Bowel: Stomach and limited of the small bowel is unremarkable  Vascular/Lymphatic: Abdominal aortic normal caliber. No retroperitoneal periportal lymphadenopathy.  Musculoskeletal: No aggressive osseous lesion  IMPRESSION: 1. No hepatic abnormality.  No biliary duct dilatation. 2. MRCP sequences are limited due to patient inability to follow commands. No gross biliary abnormality. 3. Small bilateral pleural effusions.   Electronically Signed   By: SSuzy BouchardM.D.   On: 11/14/2014 15:51   Dg Chest Port 1 View  11/13/2014   CLINICAL DATA:  Code sepsis, fever  EXAM: PORTABLE CHEST - 1 VIEW  COMPARISON:  10/11/2014  FINDINGS: Increased interstitial markings. No focal consolidation. No pleural effusion or pneumothorax.  The heart is normal in size.  IMPRESSION: No evidence of acute cardiopulmonary disease.   Electronically Signed   By: SJulian HyM.D.   On: 11/13/2014 19:04   Mr Abd W/wo Cm/mrcp  11/14/2014   CLINICAL DATA:  Sepsis with hepatic failure. Patient is confused and could not follow peripheral commands.  EXAM: MRI ABDOMEN WITHOUT AND WITH CONTRAST (INCLUDING MRCP)  TECHNIQUE: Multiplanar multisequence MR imaging of the abdomen was performed both before and after the administration of intravenous contrast. Heavily T2-weighted images of the biliary and pancreatic ducts were obtained, and three-dimensional MRCP images were rendered by post processing.  CONTRAST:  182mMULTIHANCE GADOBENATE DIMEGLUMINE 529 MG/ML IV SOLN  COMPARISON:  CT 11/13/2014  FINDINGS: Lower chest:  Small bilateral pleural effusions.  Hepatobiliary: No intrahepatic biliary duct dilatation. The MRCP sequences are severely degraded by patient respiratory motion. Patient was confused and could not follow voice commands which is required for MRI imaging. No gross biliary abnormality.  Pancreas: Normal pancreatic parenchymal intensity. No ductal dilatation or inflammation.  Spleen: Normal spleen.   Adrenals/urinary tract: Adrenal glands and kidneys are normal.  Stomach/Bowel: Stomach and limited of the small bowel is unremarkable  Vascular/Lymphatic: Abdominal aortic normal caliber. No retroperitoneal periportal lymphadenopathy.  Musculoskeletal: No aggressive osseous lesion  IMPRESSION: 1. No hepatic abnormality.  No biliary duct dilatation. 2. MRCP sequences are limited due to patient inability to follow commands. No gross biliary abnormality. 3. Small bilateral pleural effusions.   Electronically Signed   By: StSuzy Bouchard.D.   On: 11/14/2014 15:51    Microbiology: Recent Results (from the past 240 hour(s))  Blood culture (routine x 2)     Status: None (Preliminary result)   Collection Time: 11/13/14  5:40 PM  Result Value Ref Range Status   Specimen Description BLOOD LEFT ARM  Final   Special Requests   Final    BOTTLES DRAWN AEROBIC AND ANAEROBIC AEB=10CC ANA=8CC   Culture   Final    ESCHERICHIA COLI Note: Gram Stain Report Called  to,Read Back By and Verified With: CINDY PHILLIPS RN @ 3:33 PM 11/14/14 BY DWEEKS Performed at [facility]    Report Status PENDING  Incomplete  Blood culture (routine x 2)     Status: None (Preliminary result)   Collection Time: 11/13/14  5:45 PM  Result Value Ref Range Status   Specimen Description BLOOD LEFT HAND  Final   Special Requests BOTTLES DRAWN AEROBIC AND ANAEROBIC 12CC  Final   Culture   Final    ESCHERICHIA COLI Note: Gram Stain Report Called to,Read Back By and Verified With: CINDY PHILLIPS RN @ 3:33 PM 11/14/14 BY DWEEKS Performed at [facility]    Report Status PENDING  Incomplete  Urine culture     Status: None   Collection Time: 11/13/14  7:19 PM  Result Value Ref Range Status   Specimen Description URINE, CATHETERIZED  Final   Special Requests NONE  Final   Colony Count NO GROWTH Performed at [facility]   Final   Culture NO GROWTH Performed at [facility]   Final   Report Status  11/15/2014 FINAL  Final  MRSA PCR Screening     Status: None   Collection Time: 11/14/14 12:40 AM  Result Value Ref Range Status   MRSA by PCR NEGATIVE NEGATIVE Final    Comment:        The GeneXpert MRSA Assay (FDA approved for NASAL specimens only), is one component of a comprehensive MRSA colonization surveillance program. It is not intended to diagnose MRSA infection nor to guide or monitor treatment for MRSA infections.      Labs: Basic Metabolic Panel:  Recent Labs Lab 11/13/14 1745 11/14/14 0600 11/15/14 0639  NA 138 140 138  K 3.6 3.7 3.5  CL 101 107 107  CO2 _0 GLUCOSE 140* 92 92  BUN 19 16 26*  CREATININE 0.68 0.60 0.84  CALCIUM 9.3 8.6* 8.2*   Liver Function Tests:  Recent Labs Lab 11/13/14 1745 11/14/14 0600 11/15/14 0639  AST 466* 204* 91*  ALT 244* 180* 110*  ALKPHOS 174* 133* 94  BILITOT 1.9* 3.6* 2.0*  PROT 7.4 6.5 5.9*  ALBUMIN 3.4* 2.9* 2.5*    Recent Labs Lab 11/13/14 1745  LIPASE 23   No results for input(s): AMMONIA in the last 168 hours. CBC:  Recent Labs Lab 11/13/14 1745 11/14/14 0600 11/15/14 0639  WBC 8.5 14.0* 16.3*  NEUTROABS 8.2* 12.9*  --   HGB 11.9* 10.7* 11.1*  HCT 36.2 33.0* 33.8*  MCV 97.1 97.1 95.5  PLT 112* 100* 82*   Cardiac Enzymes: No results for input(s): CKTOTAL, CKMB, CKMBINDEX, TROPONINI in the last 168 hours. BNP: BNP (last 3 results) No results for input(s): BNP in the last 8760 hours.  ProBNP (last 3 results) No results for input(s): PROBNP in the last 8760 hours.  CBG: No results for input(s): GLUCAP in the last 168 hours.     SignedKelvin Cellar  [facility] 11/15/2014, 1:07 PM

## 2014-10-13 NOTE — Progress Notes (Signed)
[name] Reynolds discharged to [name] [facility] per MD order.  Discharge instructions reviewed and discussed with the patient's sister, all questions and concerns answered. Copy of instructions and scripts given to patient's sister to take to [name] Reynolds.    Medication List    STOP taking these medications        lactulose (encephalopathy) 10 GM/15ML Soln  Commonly known as:  CHRONULAC      TAKE these medications        amLODipine 5 MG tablet  Commonly known as:  NORVASC  Take 1 tablet (5 mg total) by mouth daily.     aspirin EC 81 MG tablet  Take 81 mg by mouth daily.     benztropine 0.5 MG tablet  Commonly known as:  COGENTIN  Take 0.5 mg by mouth 2 (two) times daily.     cholecalciferol 1000 UNITS tablet  Commonly known as:  VITAMIN D  Take 1,000 Units by mouth daily.     clonazePAM 0.5 MG tablet  Commonly known as:  KLONOPIN  Take 0.5 mg by mouth 2 (two) times daily.     famotidine 20 MG tablet  Commonly known as:  PEPCID  Take 20 mg by mouth at bedtime.     feeding supplement (ENSURE ENLIVE) Liqd  Take 237 mLs by mouth daily.     hydrOXYzine 25 MG capsule  Commonly known as:  VISTARIL  Take 25 mg by mouth at bedtime.     polyethylene glycol powder powder  Commonly known as:  GLYCOLAX/MIRALAX  Take 17 g by mouth daily as needed for mild constipation or moderate constipation (*Mix in 8 oz of water and drink as needed to have a bowel movement every 3 days.).     simvastatin 40 MG tablet  Commonly known as:  ZOCOR  Take 40 mg by mouth at bedtime.     traZODone 100 MG tablet  Commonly known as:  DESYREL  Take 200 mg by mouth at bedtime.     ziprasidone 40 MG capsule  Commonly known as:  GEODON  Take 40 mg by mouth 2 (two) times daily with a meal.        Patients skin is clean, dry and intact, no evidence of skin break down. IV site discontinued and catheter remains intact. Site without signs and symptoms of complications. Dressing  and pressure applied.  Patient escorted to car by Ladona Ridgelaylor, RN in a wheelchair,  no distress noted upon discharge.  Ubaldo GlassingJames, [name] 10/13/2014 12:26 PM

## 2014-10-14 NOTE — Progress Notes (Signed)
Follow-up note   [name] was discharged from my service on 10/13/2014 at which time she was treated for SIRS. Workup was unremarkable, having a negative urinalysis and a chest x-ray. During this hospitalization blood cultures remained negative up to the day of discharge. She showed significant clinical improvement, ambulating around the floor, tolerating by mouth intake. Her sister confirmed with me that she was back to baseline and felt she was ready for discharge as well. I suspected that her illness may have been related to a viral syndrome. IV antibiotics were discontinued on discharge.  I was notified today that 1 out of 2 blood cultures did turn positive for gram-negative rods. I called her facility at [phone] spoke with staff members who reported that she was doing much better, remained afebrile, was eating and drinking and basically at her baseline. I explained to staff members a blood culture results and inform them that I would be called in a prescription for an antibiotic.  A prescription for Ceftin 500 mg by mouth twice a day quantity dispensed #28 was called into the [facility] pharmacy in [place], [place] [phone].  I also called her sister Johnella MoloneyMary Madden at [phone] to notify her of blood culture results. She agreed with treating with oral antibiotic agents at her facility. I explained to staff members that patient will need short-term hospital follow-up with her primary care provider on Monday or Tuesday of next week. Will follow-up on the culture susceptibility testing.

## 2014-10-16 LAB — CULTURE, BLOOD (ROUTINE X 2): CULTURE: NO GROWTH

## 2014-10-17 NOTE — Progress Notes (Signed)
Follow up note  Culture identification and susceptibility testing were followed up from blood cultures drawn on 10/11/2015, 1 out of 2 blood cultures grew E.coli; organism was susceptible to cephalosporins. I called in to her pharmacy on 10/14/2014 a 2 week course of Ceftin 500 mg PO BID which would provide appropriate coverage. I had instructed group home to have her followed up with her PCP this week..Marland Kitchen

## 2014-10-27 DIAGNOSIS — E43 Unspecified severe protein-calorie malnutrition: Secondary | ICD-10-CM | POA: Diagnosis present

## 2014-11-13 ENCOUNTER — Inpatient Hospital Stay (HOSPITAL_COMMUNITY)
Admission: EM | Admit: 2014-11-13 | Discharge: 2014-11-17 | DRG: 871 | Disposition: A | Discharge status: Home / Self Care | Payer: MEDICARE | Attending: Internal Medicine | Admitting: Internal Medicine

## 2014-11-13 ENCOUNTER — Emergency Department (HOSPITAL_COMMUNITY): Payer: MEDICARE

## 2014-11-13 ENCOUNTER — Encounter (HOSPITAL_COMMUNITY): Payer: Self-pay | Admitting: Emergency Medicine

## 2014-11-13 DIAGNOSIS — R4701 Aphasia: Secondary | ICD-10-CM | POA: Diagnosis present

## 2014-11-13 DIAGNOSIS — F0280 Dementia in other diseases classified elsewhere without behavioral disturbance: Secondary | ICD-10-CM | POA: Diagnosis present

## 2014-11-13 DIAGNOSIS — D696 Thrombocytopenia, unspecified: Secondary | ICD-10-CM | POA: Diagnosis present

## 2014-11-13 DIAGNOSIS — A4151 Sepsis due to Escherichia coli [E. coli]: Secondary | ICD-10-CM | POA: Diagnosis present

## 2014-11-13 DIAGNOSIS — G309 Alzheimer's disease, unspecified: Secondary | ICD-10-CM | POA: Diagnosis present

## 2014-11-13 DIAGNOSIS — F259 Schizoaffective disorder, unspecified: Secondary | ICD-10-CM | POA: Diagnosis present

## 2014-11-13 DIAGNOSIS — E785 Hyperlipidemia, unspecified: Secondary | ICD-10-CM | POA: Diagnosis present

## 2014-11-13 DIAGNOSIS — I4581 Long QT syndrome: Secondary | ICD-10-CM | POA: Diagnosis present

## 2014-11-13 DIAGNOSIS — R112 Nausea with vomiting, unspecified: Secondary | ICD-10-CM | POA: Diagnosis present

## 2014-11-13 DIAGNOSIS — Z66 Do not resuscitate: Secondary | ICD-10-CM | POA: Diagnosis present

## 2014-11-13 DIAGNOSIS — I10 Essential (primary) hypertension: Secondary | ICD-10-CM | POA: Diagnosis present

## 2014-11-13 DIAGNOSIS — K5900 Constipation, unspecified: Secondary | ICD-10-CM | POA: Diagnosis not present

## 2014-11-13 DIAGNOSIS — R740 Nonspecific elevation of levels of transaminase and lactic acid dehydrogenase [LDH]: Secondary | ICD-10-CM

## 2014-11-13 DIAGNOSIS — K219 Gastro-esophageal reflux disease without esophagitis: Secondary | ICD-10-CM | POA: Diagnosis present

## 2014-11-13 DIAGNOSIS — K830 Cholangitis: Secondary | ICD-10-CM | POA: Diagnosis present

## 2014-11-13 DIAGNOSIS — R6520 Severe sepsis without septic shock: Secondary | ICD-10-CM

## 2014-11-13 DIAGNOSIS — R7401 Elevation of levels of liver transaminase levels: Secondary | ICD-10-CM | POA: Diagnosis present

## 2014-11-13 DIAGNOSIS — K838 Other specified diseases of biliary tract: Secondary | ICD-10-CM

## 2014-11-13 DIAGNOSIS — I959 Hypotension, unspecified: Secondary | ICD-10-CM

## 2014-11-13 DIAGNOSIS — R9431 Abnormal electrocardiogram [ECG] [EKG]: Secondary | ICD-10-CM | POA: Diagnosis present

## 2014-11-13 DIAGNOSIS — R7989 Other specified abnormal findings of blood chemistry: Secondary | ICD-10-CM | POA: Diagnosis not present

## 2014-11-13 DIAGNOSIS — R509 Fever, unspecified: Secondary | ICD-10-CM | POA: Diagnosis not present

## 2014-11-13 DIAGNOSIS — J189 Pneumonia, unspecified organism: Secondary | ICD-10-CM

## 2014-11-13 DIAGNOSIS — A4150 Gram-negative sepsis, unspecified: Secondary | ICD-10-CM

## 2014-11-13 DIAGNOSIS — K5641 Fecal impaction: Secondary | ICD-10-CM | POA: Diagnosis present

## 2014-11-13 DIAGNOSIS — K7200 Acute and subacute hepatic failure without coma: Secondary | ICD-10-CM

## 2014-11-13 DIAGNOSIS — A419 Sepsis, unspecified organism: Secondary | ICD-10-CM

## 2014-11-13 DIAGNOSIS — R945 Abnormal results of liver function studies: Secondary | ICD-10-CM

## 2014-11-13 LAB — CBC WITH DIFFERENTIAL/PLATELET
BASOS PCT: 0 % (ref 0–1)
Basophils Absolute: 0 10*3/uL (ref 0.0–0.1)
Eosinophils Absolute: 0 10*3/uL (ref 0.0–0.7)
Eosinophils Relative: 0 % (ref 0–5)
HCT: 36.2 % (ref 36.0–46.0)
HEMOGLOBIN: 11.9 g/dL — AB (ref 12.0–15.0)
Lymphocytes Relative: 1 % — ABNORMAL LOW (ref 12–46)
Lymphs Abs: 0.1 10*3/uL — ABNORMAL LOW (ref 0.7–4.0)
MCH: 31.9 pg (ref 26.0–34.0)
MCHC: 32.9 g/dL (ref 30.0–36.0)
MCV: 97.1 fL (ref 78.0–100.0)
Monocytes Absolute: 0.2 10*3/uL (ref 0.1–1.0)
Monocytes Relative: 2 % — ABNORMAL LOW (ref 3–12)
Neutro Abs: 8.2 10*3/uL — ABNORMAL HIGH (ref 1.7–7.7)
Neutrophils Relative %: 97 % — ABNORMAL HIGH (ref 43–77)
Platelets: 112 10*3/uL — ABNORMAL LOW (ref 150–400)
RBC: 3.73 MIL/uL — ABNORMAL LOW (ref 3.87–5.11)
RDW: 13.6 % (ref 11.5–15.5)
WBC: 8.5 10*3/uL (ref 4.0–10.5)

## 2014-11-13 LAB — URINALYSIS, ROUTINE W REFLEX MICROSCOPIC
Bilirubin Urine: NEGATIVE
GLUCOSE, UA: NEGATIVE mg/dL
Hgb urine dipstick: NEGATIVE
KETONES UR: NEGATIVE mg/dL
LEUKOCYTES UA: NEGATIVE
Nitrite: NEGATIVE
Protein, ur: NEGATIVE mg/dL
Specific Gravity, Urine: 1.01 (ref 1.005–1.030)
Urobilinogen, UA: 4 mg/dL — ABNORMAL HIGH (ref 0.0–1.0)
pH: 7 (ref 5.0–8.0)

## 2014-11-13 LAB — COMPREHENSIVE METABOLIC PANEL
ALT: 244 U/L — ABNORMAL HIGH (ref 14–54)
ANION GAP: 9 (ref 5–15)
AST: 466 U/L — ABNORMAL HIGH (ref 15–41)
Albumin: 3.4 g/dL — ABNORMAL LOW (ref 3.5–5.0)
Alkaline Phosphatase: 174 U/L — ABNORMAL HIGH (ref 38–126)
BUN: 19 mg/dL (ref 6–20)
CO2: 28 mmol/L (ref 22–32)
Calcium: 9.3 mg/dL (ref 8.9–10.3)
Chloride: 101 mmol/L (ref 101–111)
Creatinine, Ser: 0.68 mg/dL (ref 0.44–1.00)
GFR calc non Af Amer: >60 mL/min (ref >60)
GLUCOSE: 140 mg/dL — AB (ref 65–99)
Potassium: 3.6 mmol/L (ref 3.5–5.1)
Sodium: 138 mmol/L (ref 135–145)
TOTAL PROTEIN: 7.4 g/dL (ref 6.5–8.1)
Total Bilirubin: 1.9 mg/dL — ABNORMAL HIGH (ref 0.3–1.2)

## 2014-11-13 LAB — I-STAT CG4 LACTIC ACID, ED
Lactic Acid, Venous: 1.94 mmol/L (ref 0.5–2.0)
Lactic Acid, Venous: 1.85 mmol/L (ref 0.5–2.0)

## 2014-11-13 LAB — LIPASE, BLOOD: Lipase: 23 U/L (ref 22–51)

## 2014-11-13 MED ORDER — VANCOMYCIN HCL IN DEXTROSE 1-5 GM/200ML-% IV SOLN
1000.0000 mg | Freq: Once | INTRAVENOUS | Status: AC
  Administered 2014-11-13: 1000 mg via INTRAVENOUS
  Filled 2014-11-13: qty 200

## 2014-11-13 MED ORDER — IOHEXOL 300 MG/ML  SOLN
50.0000 mL | Freq: Once | INTRAMUSCULAR | Status: AC | PRN
Start: 2014-11-13 — End: 2014-11-13
  Administered 2014-11-13: 50 mL via ORAL

## 2014-11-13 MED ORDER — SODIUM CHLORIDE 0.9 % IV BOLUS (SEPSIS)
1000.0000 mL | Freq: Once | INTRAVENOUS | Status: AC
  Administered 2014-11-13: 1000 mL via INTRAVENOUS

## 2014-11-13 MED ORDER — VANCOMYCIN HCL IN DEXTROSE 750-5 MG/150ML-% IV SOLN
750.0000 mg | INTRAVENOUS | Status: DC
  Administered 2014-11-14: 750 mg via INTRAVENOUS
  Filled 2014-11-13 (×2): qty 150

## 2014-11-13 MED ORDER — PIPERACILLIN-TAZOBACTAM 3.375 G IVPB 30 MIN
3.3750 g | Freq: Once | INTRAVENOUS | Status: AC
  Administered 2014-11-13: 3.375 g via INTRAVENOUS
  Filled 2014-11-13: qty 50

## 2014-11-13 MED ORDER — SODIUM CHLORIDE 0.9 % IV BOLUS (SEPSIS): 500.0000 mL | Freq: Once | INTRAVENOUS | Status: DC

## 2014-11-13 MED ORDER — SODIUM CHLORIDE 0.9 % IV BOLUS (SEPSIS)
500.0000 mL | INTRAVENOUS | Status: AC
  Administered 2014-11-13: 500 mL via INTRAVENOUS

## 2014-11-13 MED ORDER — IOHEXOL 300 MG/ML  SOLN
100.0000 mL | Freq: Once | INTRAMUSCULAR | Status: AC | PRN
  Administered 2014-11-13: 100 mL via INTRAVENOUS

## 2014-11-13 MED ORDER — PIPERACILLIN-TAZOBACTAM 3.375 G IVPB
3.3750 g | Freq: Three times a day (TID) | INTRAVENOUS | Status: DC
  Administered 2014-11-14 – 2014-11-16 (×8): 3.375 g via INTRAVENOUS
  Filled 2014-11-13 (×11): qty 50

## 2014-11-13 MED ORDER — ACETAMINOPHEN 500 MG PO TABS
1000.0000 mg | ORAL_TABLET | Freq: Once | ORAL | Status: AC
  Administered 2014-11-13: 1000 mg via ORAL

## 2014-11-13 NOTE — Progress Notes (Signed)
ANTIBIOTIC CONSULT NOTE - FOLLOW UP  Pharmacy Consult for Vancomycin and Zosyn  Indication: rule out sepsis  No Known Allergies  Patient Measurements: Weight: 107 lb (48.535 kg)   Vital Signs: Temp: 101.2 F (38.4 C) (06/13 1937) Temp Source: Rectal (06/13 1937) BP: 93/58 mmHg (06/13 2300) Pulse Rate: 115 (06/13 2300) Intake/Output from previous day:   Intake/Output from this shift:    Labs:  Recent Labs  11/13/14 1745  WBC 8.5  HGB 11.9*  PLT 112*  CREATININE 0.68   Estimated Creatinine Clearance: 40.1 mL/min (by C-G formula based on Cr of 0.68). No results for input(s): VANCOTROUGH, VANCOPEAK, VANCORANDOM, GENTTROUGH, GENTPEAK, GENTRANDOM, TOBRATROUGH, TOBRAPEAK, TOBRARND, AMIKACINPEAK, AMIKACINTROU, AMIKACIN in the last 72 hours.   Microbiology: No results found for this or any previous visit (from the past 720 hour(s)).  Anti-infectives    Start     Dose/Rate Route Frequency Ordered Stop   11/13/14 1815  piperacillin-tazobactam (ZOSYN) IVPB 3.375 g     3.375 g 100 mL/hr over 30 Minutes Intravenous  Once 11/13/14 1800 11/13/14 2002   11/13/14 1815  vancomycin (VANCOCIN) IVPB 1000 mg/200 mL premix     1,000 mg 200 mL/hr over 60 Minutes Intravenous  Once 11/13/14 1800 11/13/14 2039      Assessment: Okay for Protocol, initial doses in ED. Admission plans pending.  Goal of Therapy:  Vancomycin trough level 15-20 mcg/ml  Eradicate infection.   Plan:  Vancomycin 750mg  IV every 24 hours. Measure antibiotic drug levels at steady state Follow up culture results  Zosyn 3.375gm IV every 8 hours. Follow-up micro data, labs, vitals.   Mady Gemma 11/13/2014,11:19 PM

## 2014-11-13 NOTE — Progress Notes (Signed)
Pharmacy Note:  Initial antibiotics for Vancomycin and Zosyn ordered by EDP for Sepsis.  CrCl cannot be calculated (Patient has no serum creatinine result on file.).   No Known Allergies  Filed Vitals:   11/13/14 1703  BP: 135/104  Pulse: 129  Temp: 103.5 F (39.7 C)  Resp: 23    Anti-infectives    Start     Dose/Rate Route Frequency Ordered Stop   11/13/14 1815  piperacillin-tazobactam (ZOSYN) IVPB 3.375 g     3.375 g 100 mL/hr over 30 Minutes Intravenous  Once 11/13/14 1800     11/13/14 1815  vancomycin (VANCOCIN) IVPB 1000 mg/200 mL premix     1,000 mg 200 mL/hr over 60 Minutes Intravenous  Once 11/13/14 1800        Plan: Initial doses of Vancomycin 1gm and Zosyn 3.375gm X 1 ordered. F/U admission orders for further dosing if therapy continued.  Mady Gemma, [facility] 11/13/2014 6:09 PM

## 2014-11-13 NOTE — ED Notes (Addendum)
Caregiver from [facility] states patient was seen at [facility] for fever and vomiting starting today and was told to come to ER. States "her temperature was 106 there and they didn't give her anything." Patient has dementia and unable to get oral temperature at triage. Axillary temp 103.5 at triage.

## 2014-11-13 NOTE — ED Provider Notes (Signed)
TIME SEEN: 5:50 PM  CHIEF COMPLAINT: Fever, vomiting  HPI: Pt is a 81 y.o. female with history of hypertension, hyperlipidemia, seizures no longer on anti-epileptics, dementia who lives in a group home who presents to the emergency department with her caregiver and sister who provided the history. They state that she was doing well this morning, ate breakfast normally. They state after eating lunch she had one episode of nonbloody, nonbilious vomiting. Has been holding her stomach. They noticed that she felt very warm. They took her to her family doctor who checked her temperature and it was 106 axillary and she had a heart rate in the 120s. They were instructed to bring the patient to the emergency department. Patient is nonverbal at baseline. They deny any diarrhea. No cough. State they were here a month ago and patient had a urinary tract infection. She is not currently on antibiotics.  PCP is [facility]  ROS: Level V caveat for dementia  PAST MEDICAL HISTORY/PAST SURGICAL HISTORY:  Past Medical History  Diagnosis Date  . HTN (hypertension)   . Hyperlipemia   . Seizures   . GERD (gastroesophageal reflux disease)   . Alzheimer's dementia   . Insomnia   . Vitamin D deficiency   . Schizoaffective disorder     MEDICATIONS:  Prior to Admission medications   Medication Sig Start Date End Date Taking? Authorizing Provider  amLODipine (NORVASC) 5 MG tablet Take 1 tablet (5 mg total) by mouth daily. 10/13/14   Jeralyn Bennett, MD  aspirin EC 81 MG tablet Take 81 mg by mouth daily.    Historical Provider, MD  benztropine (COGENTIN) 0.5 MG tablet Take 0.5 mg by mouth 2 (two) times daily.    Historical Provider, MD  cholecalciferol (VITAMIN D) 1000 UNITS tablet Take 1,000 Units by mouth daily.    Historical Provider, MD  clonazePAM (KLONOPIN) 0.5 MG tablet Take 0.5 mg by mouth 2 (two) times daily.    Historical Provider, MD  famotidine (PEPCID) 20 MG tablet Take 20 mg by mouth at  bedtime.    Historical Provider, MD  feeding supplement, ENSURE ENLIVE, (ENSURE ENLIVE) LIQD Take 237 mLs by mouth daily. 10/13/14   Jeralyn Bennett, MD  hydrOXYzine (VISTARIL) 25 MG capsule Take 25 mg by mouth at bedtime.    Historical Provider, MD  polyethylene glycol powder (GLYCOLAX/MIRALAX) powder Take 17 g by mouth daily as needed for mild constipation or moderate constipation (*Mix in 8 oz of water and drink as needed to have a bowel movement every 3 days.).    Historical Provider, MD  simvastatin (ZOCOR) 40 MG tablet Take 40 mg by mouth at bedtime.    Historical Provider, MD  traZODone (DESYREL) 100 MG tablet Take 200 mg by mouth at bedtime.    Historical Provider, MD  ziprasidone (GEODON) 40 MG capsule Take 40 mg by mouth 2 (two) times daily with a meal.    Historical Provider, MD    ALLERGIES:  No Known Allergies  SOCIAL HISTORY:  History  Substance Use Topics  . Smoking status: Never Smoker   . Smokeless tobacco: Not on file  . Alcohol Use: No    FAMILY HISTORY: History reviewed. No pertinent family history.  EXAM: BP 135/104 mmHg  Pulse 129  Temp(Src) 103.5 F (39.7 C) (Axillary)  Resp 23  Wt 107 lb (48.535 kg)  SpO2 96% CONSTITUTIONAL: Awake and alert but does not answer questions or follow commands, elderly, chronically ill-appearing HEAD: Normocephalic EYES: Conjunctivae clear, PERRL ENT:  normal nose; no rhinorrhea; moist mucous membranes; pharynx without lesions noted NECK: Supple, no meningismus, no LAD  CARD: Regular and tachycardic; S1 and S2 appreciated; no murmurs, no clicks, no rubs, no gallops RESP: Normal chest excursion without splinting or tachypnea; breath sounds clear and equal bilaterally; no wheezes, no rhonchi, no rales, no hypoxia or respiratory distress, speaking full sentences ABD/GI: Normal bowel sounds; non-distended; soft, appears to be tender throughout her abdomen with no focality, no rebound, no guarding, no peritoneal signs BACK:  The  back appears normal and is non-tender to palpation, there is no CVA tenderness EXT: Normal ROM in all joints; non-tender to palpation; no edema; normal capillary refill; no cyanosis, no calf tenderness or swelling    SKIN: Normal color for age and race; warm NEURO: Moves all extremities equally, unable to answer questions or follow commands which family reports is her baseline   MEDICAL DECISION MAKING: Patient here with fever, vomiting. Was recently admitted to the hospital and found to have cholelithiasis and gallbladder wall thickening and elevated LFTs. At that time also had urinary tract infection. She is mild degenerative palpation throughout her entire abdomen without obvious focality. No surgical abdomen. We'll obtain septic workup as well as a CT of her abdomen and pelvis. Unable to obtain an ultrasound at this time. We'll give vancomycin, Zosyn, IV fluids. She has received Tylenol in triage. Discussed with sister at bedside who is patient's power of attorney. She states that she would like chest compressions, electric shocks of patient's heart were to stop beating but she would not want intubation.  ED PROGRESS: Patient continues to have slightly low blood pressures despite 2-1/2 L of IV fluids. Heart rate has improved. Labs show predominant neutrophils but no leukocytosis. Lactate is normal. Urine shows no sign of infection. Chest x-ray was clear. She does have markedly elevated liver function tests which she has had previously. CT scan shows a mild focal right basilar airspace opacity concerning for pneumonia. She also has mild intrahepatic biliary ductal dilatation with the common bile duct measuring 1.0 cm. There is trace pericholecystic fluid noted but no gallbladder wall thickening. She is not somatically tender in the right upper quadrant but exam is limited given her dementia. Have discussed with Dr. Darrick Penna with gastroenterology who agrees to see the patient in consult tomorrow and agrees  with possible MRCP, ERCP as needed but will further assess patient again tomorrow. We'll discuss with hospitalist for admission to step down.   11:30 PM  Pt's blood pressure is 93/58. Will give another 500 mg fluid bolus. Discussed with Dr. Onalee Hua with hospitalist service for admission to step down.   EKG Interpretation  Date/Time:  Monday November 13 2014 18:06:17 EDT Ventricular Rate:  129 PR Interval:  141 QRS Duration: 72 QT Interval:  415 QTC Calculation: 608 R Axis:   45 Text Interpretation:  Sinus tachycardia Probable left atrial enlargement Abnormal R-wave progression, early transition Prolonged QT interval Baseline wander in lead(s) II III aVR aVF Confirmed by Zunairah Devers,  DO, Fanta Wimberley ([id number]) on 11/13/2014 6:30:05 PM         CRITICAL CARE Performed by: Raelyn Number   Total critical care time: 40 minutes  Critical care time was exclusive of separately billable procedures and treating other patients.  Critical care was necessary to treat or prevent imminent or life-threatening deterioration.  Critical care was time spent personally by me on the following activities: development of treatment plan with patient and/or surrogate as well as nursing, discussions  with consultants, evaluation of patient's response to treatment, examination of patient, obtaining history from patient or surrogate, ordering and performing treatments and interventions, ordering and review of laboratory studies, ordering and review of radiographic studies, pulse oximetry and re-evaluation of patient's condition.   Layla Maw Abe Schools, DO 11/13/14 [phone]

## 2014-11-14 ENCOUNTER — Inpatient Hospital Stay (HOSPITAL_COMMUNITY): Payer: MEDICARE

## 2014-11-14 ENCOUNTER — Encounter (HOSPITAL_COMMUNITY): Payer: Self-pay | Admitting: *Deleted

## 2014-11-14 DIAGNOSIS — R4701 Aphasia: Secondary | ICD-10-CM

## 2014-11-14 DIAGNOSIS — J189 Pneumonia, unspecified organism: Secondary | ICD-10-CM | POA: Insufficient documentation

## 2014-11-14 DIAGNOSIS — R112 Nausea with vomiting, unspecified: Secondary | ICD-10-CM

## 2014-11-14 DIAGNOSIS — I4581 Long QT syndrome: Secondary | ICD-10-CM

## 2014-11-14 DIAGNOSIS — R945 Abnormal results of liver function studies: Secondary | ICD-10-CM

## 2014-11-14 DIAGNOSIS — R7989 Other specified abnormal findings of blood chemistry: Secondary | ICD-10-CM

## 2014-11-14 DIAGNOSIS — K838 Other specified diseases of biliary tract: Secondary | ICD-10-CM | POA: Diagnosis present

## 2014-11-14 DIAGNOSIS — A419 Sepsis, unspecified organism: Secondary | ICD-10-CM

## 2014-11-14 DIAGNOSIS — R9431 Abnormal electrocardiogram [ECG] [EKG]: Secondary | ICD-10-CM | POA: Diagnosis present

## 2014-11-14 DIAGNOSIS — R740 Nonspecific elevation of levels of transaminase and lactic acid dehydrogenase [LDH]: Secondary | ICD-10-CM

## 2014-11-14 LAB — BASIC METABOLIC PANEL
Anion gap: 8 (ref 5–15)
BUN: 16 mg/dL (ref 6–20)
CALCIUM: 8.6 mg/dL — AB (ref 8.9–10.3)
CO2: 25 mmol/L (ref 22–32)
Chloride: 107 mmol/L (ref 101–111)
Creatinine, Ser: 0.6 mg/dL (ref 0.44–1.00)
GFR calc Af Amer: >60 mL/min (ref >60)
GLUCOSE: 92 mg/dL (ref 65–99)
Potassium: 3.7 mmol/L (ref 3.5–5.1)
Sodium: 140 mmol/L (ref 135–145)

## 2014-11-14 LAB — CBC WITH DIFFERENTIAL/PLATELET
Basophils Absolute: 0 10*3/uL (ref 0.0–0.1)
Basophils Relative: 0 % (ref 0–1)
EOS ABS: 0 10*3/uL (ref 0.0–0.7)
EOS PCT: 0 % (ref 0–5)
HEMATOCRIT: 33 % — AB (ref 36.0–46.0)
Hemoglobin: 10.7 g/dL — ABNORMAL LOW (ref 12.0–15.0)
Lymphocytes Relative: 2 % — ABNORMAL LOW (ref 12–46)
Lymphs Abs: 0.3 10*3/uL — ABNORMAL LOW (ref 0.7–4.0)
MCH: 31.5 pg (ref 26.0–34.0)
MCHC: 32.4 g/dL (ref 30.0–36.0)
MCV: 97.1 fL (ref 78.0–100.0)
Monocytes Absolute: 0.8 10*3/uL (ref 0.1–1.0)
Monocytes Relative: 6 % (ref 3–12)
Neutro Abs: 12.9 10*3/uL — ABNORMAL HIGH (ref 1.7–7.7)
Neutrophils Relative %: 92 % — ABNORMAL HIGH (ref 43–77)
PLATELETS: 100 10*3/uL — AB (ref 150–400)
RBC: 3.4 MIL/uL — ABNORMAL LOW (ref 3.87–5.11)
RDW: 14 % (ref 11.5–15.5)
WBC Morphology: INCREASED
WBC: 14 10*3/uL — AB (ref 4.0–10.5)

## 2014-11-14 LAB — LACTIC ACID, PLASMA: Lactic Acid, Venous: 2.3 mmol/L (ref 0.5–2.0)

## 2014-11-14 LAB — PROTIME-INR
INR: 1.21 (ref 0.00–1.49)
Prothrombin Time: 15.5 seconds — ABNORMAL HIGH (ref 11.6–15.2)

## 2014-11-14 LAB — MRSA PCR SCREENING: MRSA by PCR: NEGATIVE

## 2014-11-14 LAB — MONONUCLEOSIS SCREEN: Mono Screen: NEGATIVE

## 2014-11-14 LAB — PROCALCITONIN: Procalcitonin: 64.05 ng/mL

## 2014-11-14 LAB — HEPATIC FUNCTION PANEL
ALBUMIN: 2.9 g/dL — AB (ref 3.5–5.0)
ALK PHOS: 133 U/L — AB (ref 38–126)
ALT: 180 U/L — AB (ref 14–54)
AST: 204 U/L — ABNORMAL HIGH (ref 15–41)
Bilirubin, Direct: 2.4 mg/dL — ABNORMAL HIGH (ref 0.1–0.5)
Indirect Bilirubin: 1.2 mg/dL — ABNORMAL HIGH (ref 0.3–0.9)
Total Bilirubin: 3.6 mg/dL — ABNORMAL HIGH (ref 0.3–1.2)
Total Protein: 6.5 g/dL (ref 6.5–8.1)

## 2014-11-14 LAB — APTT: aPTT: 33 seconds (ref 24–37)

## 2014-11-14 MED ORDER — PANTOPRAZOLE SODIUM 40 MG IV SOLR
40.0000 mg | Freq: Two times a day (BID) | INTRAVENOUS | Status: DC
  Administered 2014-11-14: 40 mg via INTRAVENOUS
  Filled 2014-11-14: qty 40

## 2014-11-14 MED ORDER — ACETAMINOPHEN 325 MG PO TABS
650.0000 mg | ORAL_TABLET | Freq: Four times a day (QID) | ORAL | Status: DC | PRN
  Administered 2014-11-14: 650 mg via ORAL
  Filled 2014-11-14: qty 2

## 2014-11-14 MED ORDER — PANTOPRAZOLE SODIUM 40 MG PO TBEC
40.0000 mg | DELAYED_RELEASE_TABLET | Freq: Two times a day (BID) | ORAL | Status: DC
  Administered 2014-11-14 – 2014-11-17 (×6): 40 mg via ORAL
  Filled 2014-11-14 (×6): qty 1

## 2014-11-14 MED ORDER — PIPERACILLIN-TAZOBACTAM 3.375 G IVPB
INTRAVENOUS | Status: AC
  Filled 2014-11-14: qty 50

## 2014-11-14 MED ORDER — IBUPROFEN 100 MG/5ML PO SUSP
400.0000 mg | Freq: Once | ORAL | Status: AC
  Administered 2014-11-14: 400 mg via ORAL
  Filled 2014-11-14: qty 20

## 2014-11-14 MED ORDER — SODIUM CHLORIDE 0.9 % IV SOLN: INTRAVENOUS | Status: DC

## 2014-11-14 MED ORDER — ZIPRASIDONE HCL 40 MG PO CAPS
40.0000 mg | ORAL_CAPSULE | Freq: Every day | ORAL | Status: DC
  Administered 2014-11-14 – 2014-11-17 (×4): 40 mg via ORAL
  Filled 2014-11-14 (×7): qty 1

## 2014-11-14 MED ORDER — ENSURE PUDDING PO PUDG
1.0000 | Freq: Three times a day (TID) | ORAL | Status: DC
  Administered 2014-11-15 – 2014-11-17 (×6): 1 via ORAL

## 2014-11-14 MED ORDER — GADOBENATE DIMEGLUMINE 529 MG/ML IV SOLN
10.0000 mL | Freq: Once | INTRAVENOUS | Status: AC | PRN
  Administered 2014-11-14: 10 mL via INTRAVENOUS

## 2014-11-14 MED ORDER — SODIUM CHLORIDE 0.9 % IV SOLN
INTRAVENOUS | Status: DC
  Administered 2014-11-14 – 2014-11-16 (×5): via INTRAVENOUS

## 2014-11-14 MED ORDER — TRAZODONE HCL 50 MG PO TABS
200.0000 mg | ORAL_TABLET | Freq: Every day | ORAL | Status: DC
  Administered 2014-11-14 – 2014-11-16 (×3): 200 mg via ORAL
  Filled 2014-11-14 (×3): qty 4

## 2014-11-14 MED ORDER — ENOXAPARIN SODIUM 40 MG/0.4ML ~~LOC~~ SOLN
40.0000 mg | SUBCUTANEOUS | Status: DC
  Administered 2014-11-14 – 2014-11-15 (×2): 40 mg via SUBCUTANEOUS
  Filled 2014-11-14 (×2): qty 0.4

## 2014-11-14 MED ORDER — ASPIRIN EC 81 MG PO TBEC
81.0000 mg | DELAYED_RELEASE_TABLET | Freq: Every day | ORAL | Status: DC
  Administered 2014-11-14: 81 mg via ORAL
  Filled 2014-11-14: qty 1

## 2014-11-14 NOTE — Plan of Care (Signed)
I PERSONALLY REVIEWED CT WITH DR. Amil Amen- NO CBD STONE/BILIARY DILATION, NL PANCREAS. NO NEED FOR ERCP AT THIS TIME. WILL DISCUSS WITH ID JUN 15. Pt NEEDS SEROLOGIES FOR AIH, PBC, and MAY NEED AN ECHO AND LIVER BIOPSY.

## 2014-11-14 NOTE — Progress Notes (Signed)
Critical lactic acid of 2.3 called by lab.  MD made aware via text page.

## 2014-11-14 NOTE — Clinical Social Work Note (Signed)
Clinical Social Work Assessment  Patient Details  Name: [name] Reynolds MRN: [id number] Date of Birth: 03/23/1929  Date of referral:  11/14/14               Reason for consult:  Facility Placement                Permission sought to share information with:    Permission granted to share information::     Name::        Agency::     Relationship::     Contact Information:     Housing/Transportation Living arrangements for the past 2 months:  [place] of Information:  Other (Comment Required) (Sister, Ernest Haber and Elza Rafter, [employer] Patient Interpreter Needed:  None Criminal Activity/Legal Involvement Pertinent to Current Situation/Hospitalization:  No - Comment as needed Significant Relationships:  Siblings Lives with:  Facility Resident Do you feel safe going back to the place where you live?  Yes Need for family participation in patient care:  Yes (Comment)  Care giving concerns:  Patient is in a facility.    Social Worker assessment / plan:  CSW met with patient who is basically nonverbal. Patient's sister, Brigitte Pulse, was at bedside feeding patient.  Jetty Peeks advised that patient has been at [facility] for over 20 years.  She advised that patient ambulated independently up until about a month ago.  She stated that patient had to use a wheelchair recently due to becoming ill and being in pain.  Ms. Rowe Clack advised that she visits patient at least weekly.  She stated that she last visited patient on this past Saturday and that patient was "fine." Ms. Rowe Clack stated that patient may have to have surgery and that she may have to be transferred to another hospital for the surgery.  She stated that she desired for patient to return to [facility] upon discharge. CSW spoke with Elza Rafter, Scientist, physiological at [facility].  Ms. Laverta Baltimore confirmed Ms. Madden's statements. She advised that patient can return to the facility upon discharge.   Employment status:   Disabled (Comment on whether or not currently receiving Disability) Insurance information:  Managed Medicare PT Recommendations:  Not assessed at this time Information / Referral to community resources:     Patient/Family's Response to care: Family and facility agreeable for patient to return to facility upon discharge.   Patient/Family's Understanding of and Emotional Response to Diagnosis, Current Treatment, and Prognosis: Patient's sister verbalized understanding of patient's diagnosis, treatment and prognosis.    Emotional Assessment Appearance:  Developmentally appropriate Attitude/Demeanor/Rapport:    Affect (typically observed):  Calm Orientation:   (Unable to assess) Alcohol / Substance use:  Not Applicable Psych involvement (Current and /or in the community):  No (Comment)  Discharge Needs  Concerns to be addressed:  Discharge Planning Concerns Readmission within the last 30 days:  No Current discharge risk:    Barriers to Discharge:      Ihor Gully, [name] 11/14/2014, 2:35 PM  [phone]

## 2014-11-14 NOTE — H&P (Signed)
PCP:   [facility]   Chief Comlaint Fever  HPI: 81 yo female h/o dementia, nonverbal at baseline since childhood, htn comes in from PCP office with report of temp of 106.  Pt ate lunch today and started vomiting acutely.  Difficult to tell if pt having pain, she was sent to PCP noted to have high temp sent to ED.  Her sister is present and reports that pt is back to her baseline right now.  She does verbalized sounds, but cannot hold a conversation.  No report of coughing, or fevers prior to today.  No report of diarrhea.  Had one episode of vomiting today,  None since arrival to ED.  Skin no rashes.  Temp here is 103.    Review of Systems:  Positive and negative as per HPI otherwise all other systems are negative per sister  Past Medical History: Past Medical History  Diagnosis Date  . HTN (hypertension)   . Hyperlipemia   . Seizures   . GERD (gastroesophageal reflux disease)   . Alzheimer's dementia   . Insomnia   . Vitamin D deficiency   . Schizoaffective disorder    Past Surgical History  Procedure Laterality Date  . Stomach surgery      Medications: Prior to Admission medications   Medication Sig Start Date End Date Taking? Authorizing Provider  amLODipine (NORVASC) 5 MG tablet Take 1 tablet (5 mg total) by mouth daily. 10/13/14  Yes Jeralyn Bennett, MD  aspirin EC 81 MG tablet Take 81 mg by mouth daily.   Yes Historical Provider, MD  benztropine (COGENTIN) 0.5 MG tablet Take 0.5 mg by mouth 2 (two) times daily.   Yes Historical Provider, MD  cholecalciferol (VITAMIN D) 1000 UNITS tablet Take 1,000 Units by mouth daily.   Yes Historical Provider, MD  clonazePAM (KLONOPIN) 0.5 MG tablet Take 0.5 mg by mouth 2 (two) times daily.   Yes Historical Provider, MD  famotidine (PEPCID) 20 MG tablet Take 20 mg by mouth at bedtime.   Yes Historical Provider, MD  feeding supplement, ENSURE ENLIVE, (ENSURE ENLIVE) LIQD Take 237 mLs by mouth daily. 10/13/14  Yes  Jeralyn Bennett, MD  hydrOXYzine (VISTARIL) 25 MG capsule Take 25 mg by mouth at bedtime.   Yes Historical Provider, MD  lactulose (CHRONULAC) 10 GM/15ML solution Take 20 g by mouth 2 (two) times daily.   Yes Historical Provider, MD  polyethylene glycol powder (GLYCOLAX/MIRALAX) powder Take 17 g by mouth daily as needed for mild constipation or moderate constipation (*Mix in 8 oz of water and drink as needed to have a bowel movement every 3 days.).   Yes Historical Provider, MD  simvastatin (ZOCOR) 40 MG tablet Take 40 mg by mouth at bedtime.   Yes Historical Provider, MD  traZODone (DESYREL) 100 MG tablet Take 200 mg by mouth at bedtime.   Yes Historical Provider, MD  ziprasidone (GEODON) 40 MG capsule Take 40 mg by mouth daily.    Yes Historical Provider, MD    Allergies:  No Known Allergies  Social History:  reports that she has never smoked. She does not have any smokeless tobacco history on file. She reports that she does not drink alcohol or use illicit drugs.  Family History: Unobtainable from pt due to dementia  Physical Exam: Filed Vitals:   11/13/14 2214 11/13/14 2230 11/13/14 2300 11/13/14 2346  BP: 117/80 104/68 93/58   Pulse: 120 121 115   Temp:  TempSrc:      Resp: 25 20 18    Height:    5\' 8"  (1.727 m)  Weight:    48.535 kg (107 lb)  SpO2: 96% 96% 90%    General appearance: alert and no distress Head: Normocephalic, without obvious abnormality, atraumatic Eyes: negative Nose: Nares normal. Septum midline. Mucosa normal. No drainage or sinus tenderness. Neck: no JVD and supple, symmetrical, trachea midline Lungs: clear to auscultation bilaterally Heart: regular rate and rhythm, S1, S2 normal, no murmur, click, rub or gallop Abdomen: soft, non-tender; bowel sounds normal; no masses,  no organomegaly Extremities: extremities normal, atraumatic, no cyanosis or edema Pulses: 2+ and symmetric Skin: Skin color, texture, turgor normal. No rashes or  lesions Neurologic: Grossly normal   Labs on Admission:   Recent Labs  11/13/14 1745  NA 138  K 3.6  CL 101  CO2 28  GLUCOSE 140*  BUN 19  CREATININE 0.68  CALCIUM 9.3    Recent Labs  11/13/14 1745  AST 466*  ALT 244*  ALKPHOS 174*  BILITOT 1.9*  PROT 7.4  ALBUMIN 3.4*    Recent Labs  11/13/14 1745  LIPASE 23    Recent Labs  11/13/14 1745  WBC 8.5  NEUTROABS 8.2*  HGB 11.9*  HCT 36.2  MCV 97.1  PLT 112*    Radiological Exams on Admission: Ct Abdomen Pelvis W Contrast  11/13/2014   CLINICAL DATA:  Acute onset of fever and vomiting. Initial encounter.  EXAM: CT ABDOMEN AND PELVIS WITH CONTRAST  TECHNIQUE: Multidetector CT imaging of the abdomen and pelvis was performed using the standard protocol following bolus administration of intravenous contrast.  CONTRAST:  OMNIPAQUE IOHEXOL 300 MG/ML  SOLN  COMPARISON:  CT of the abdomen and pelvis from 10/11/2014  FINDINGS: Mild focal right basilar airspace opacity raises concern for pneumonia.  There is suggestion of mild intrahepatic biliary ductal dilatation; this is only minimally more prominent than on the prior study. The common bile duct measures up to 1.0 cm in diameter, mildly dilated for age. Trace pericholecystic fluid is noted. This could reflect distal obstruction. The liver and spleen are otherwise unremarkable. The pancreas is grossly unremarkable in appearance. The adrenal glands are grossly unremarkable.  A small 7 mm cyst is noted at the upper pole of the right kidney. The kidneys are otherwise unremarkable. There is no evidence of hydronephrosis. No renal or ureteral stones are seen. No perinephric stranding is appreciated.  No free fluid is identified. The small bowel is unremarkable in appearance. The stomach is within normal limits. No acute vascular abnormalities are seen. Scattered calcification is noted along the abdominal aorta and its branches. Mild surrounding retroperitoneal edema is noted, of  uncertain significance, with associated nodes measuring up to 8 mm in short axis. There is an unusual collection of calcified nodes within the mesentery at the left mid abdomen, possibly reflecting remote granulomatous disease, unchanged from the recent prior study.  The appendix is not definitely characterized; there is no evidence for appendicitis. Contrast progresses to the level of the proximal transverse colon. The colon is largely filled with stool, and the rectum is distended to 9.4 cm in transverse dimension with stool, raising concern for constipation and mild fecal impaction.  The bladder is mildly distended and grossly unremarkable. The patient is status post hysterectomy. No suspicious adnexal masses are seen. No inguinal lymphadenopathy is seen.  No acute osseous abnormalities are identified. Facet disease is noted along the lumbar spine.  IMPRESSION: 1.  Mild focal right basilar airspace opacity raises concern for pneumonia. 2. Suggestion of mild intrahepatic biliary ductal dilatation, only minimally more prominent than on the prior study. Common bile duct measures up to 1.0 cm in diameter, mildly dilated for age. Trace pericholecystic fluid noted. This could reflect mild intermittent distal obstruction, though the patient's bilirubin has decreased since May. If the patient's symptoms persist, MRCP or ERCP could be considered for further evaluation. 3. Colon largely filled with stool. Rectum distended to 9.4 cm in transverse dimension with stool, raising concern for constipation and mild fecal impaction. 4. Mild retroperitoneal edema is somewhat more prominent than on the prior study and of uncertain significance, with associated nodes measuring up to 8 mm in short axis, relatively stable in size. 5. Unusual collection of calcified nodes within the mesentery at the left mid abdomen, possibly reflecting remote granulomatous disease. Sequelae of prior tuberculosis infection could have a similar  appearance; would correlate with the patient's history. 6. Scattered calcification along the abdominal aorta and its branches. 7. Small right renal cyst noted.   Electronically Signed   By: Roanna Raider M.D.   On: 11/13/2014 21:50   Dg Chest Port 1 View  11/13/2014   CLINICAL DATA:  Code sepsis, fever  EXAM: PORTABLE CHEST - 1 VIEW  COMPARISON:  10/11/2014  FINDINGS: Increased interstitial markings. No focal consolidation. No pleural effusion or pneumothorax.  The heart is normal in size.  IMPRESSION: No evidence of acute cardiopulmonary disease.   Electronically Signed   By: Charline Bills M.D.   On: 11/13/2014 19:04   cxr reviewed and normal Old records reviewed Case discussed with edp dr ward ekg reviewed, sinus tachy no acute change qtc over 600  Assessment/Plan  81 yo female with sepsis likely from lung  Principal Problem:   Sepsis-  Ct shows possible infiltrate at right base.  Cover for pna with vanc/zosyn.  Follow sepsis protocol.  Add on procalcitonin.  Continue ivf, bp improving after 2 liters ivf bolus.  abd exam is benign.  Place on pna pathway.  Proceed to pressors if needed, but ivf may be enough.  Cover for aspiration, probably high aspiration risk.  Active Problems:   Transaminitis-  Gi called and to see in am.  Repeat lfts in am.   Nausea & vomiting-  None since arrival   Alzheimer's dementia- notoed   Nonverbal- noted   QT prolongation-  Will repeat ekg in am, follow qtc interval.  Pt is DNI.  Admit to stepdown.    DAVID,RACHAL A 11/14/2014, 12:08 AM

## 2014-11-14 NOTE — Progress Notes (Signed)
[facility] PROGRESS NOTE  [name] Reynolds ZOX:[id number] DOB: 03-Oct-1928    PCP:   [facility]   HPI: [name] Reynolds is an 81 y.o. female with hx of dementia, nonverbal, HLD, HTN, with DNR code status, admitted for sepsis, possible HCAP, with elevated LFTs and CT showed showed mild biliary duct dilatation, and slight infiltrate suggestive of PNA.  She is hemodynamically stable, and appears more alert today.  She has no SOB or coughs. BC was positive for GNR.  She is currently on Van/Zosy.   Rewiew of Systems: Unable.   Past Medical History  Diagnosis Date  . HTN (hypertension)   . Hyperlipemia   . Seizures   . GERD (gastroesophageal reflux disease)   . Alzheimer's dementia   . Insomnia   . Vitamin D deficiency   . Schizoaffective disorder     Past Surgical History  Procedure Laterality Date  . Stomach surgery      Medications:  HOME MEDS: Prior to Admission medications   Medication Sig Start Date End Date Taking? Authorizing Provider  amLODipine (NORVASC) 5 MG tablet Take 1 tablet (5 mg total) by mouth daily. 10/13/14  Yes Jeralyn Bennett, MD  aspirin EC 81 MG tablet Take 81 mg by mouth daily.   Yes Historical Provider, MD  benztropine (COGENTIN) 0.5 MG tablet Take 0.5 mg by mouth 2 (two) times daily.   Yes Historical Provider, MD  cholecalciferol (VITAMIN D) 1000 UNITS tablet Take 1,000 Units by mouth daily.   Yes Historical Provider, MD  clonazePAM (KLONOPIN) 0.5 MG tablet Take 0.5 mg by mouth 2 (two) times daily.   Yes Historical Provider, MD  famotidine (PEPCID) 20 MG tablet Take 20 mg by mouth at bedtime.   Yes Historical Provider, MD  feeding supplement, ENSURE ENLIVE, (ENSURE ENLIVE) LIQD Take 237 mLs by mouth daily. 10/13/14  Yes Jeralyn Bennett, MD  hydrOXYzine (VISTARIL) 25 MG capsule Take 25 mg by mouth at bedtime.   Yes Historical Provider, MD  lactulose (CHRONULAC) 10 GM/15ML solution Take 20 g by mouth 2 (two) times daily.   Yes  Historical Provider, MD  polyethylene glycol powder (GLYCOLAX/MIRALAX) powder Take 17 g by mouth daily as needed for mild constipation or moderate constipation (*Mix in 8 oz of water and drink as needed to have a bowel movement every 3 days.).   Yes Historical Provider, MD  simvastatin (ZOCOR) 40 MG tablet Take 40 mg by mouth at bedtime.   Yes Historical Provider, MD  traZODone (DESYREL) 100 MG tablet Take 200 mg by mouth at bedtime.   Yes Historical Provider, MD  ziprasidone (GEODON) 40 MG capsule Take 40 mg by mouth daily.    Yes Historical Provider, MD     Allergies:  No Known Allergies  Social History:   reports that she has never smoked. She does not have any smokeless tobacco history on file. She reports that she does not drink alcohol or use illicit drugs.  Family History: History reviewed. No pertinent family history.   Physical Exam: Filed Vitals:   11/14/14 0400 11/14/14 0700 11/14/14 0715 11/14/14 0900  BP: 152/112 93/54  134/60  Pulse: 110 104  34  Temp: 98.5 F (36.9 C)  100.6 F (38.1 C)   TempSrc: Axillary  Axillary   Resp: Height:      Weight:      SpO2: 100% 100%  100%   Blood pressure 134/60, pulse 34, temperature 100.6 F (38.1 C), temperature  source Axillary, resp. rate 13, height 5\' 8"  (1.727 m), weight 48.535 kg (107 lb), SpO2 100 %.  GEN:  Pleasant  patient lying in the stretcher in no acute distress; slurred speech.  PSYCH:  does not appear anxious or depressed; affect is appropriate. HEENT: Mucous membranes pink and anicteric; PERRLA; EOM intact; no cervical lymphadenopathy nor thyromegaly or carotid bruit; no JVD; There were no stridor. Neck is very supple. Breasts:: Not examined CHEST WALL: No tenderness CHEST: Normal respiration, clear to auscultation bilaterally.  HEART: Regular rate and rhythm.  There are no murmur, rub, or gallops.   BACK: No kyphosis or scoliosis; no CVA tenderness ABDOMEN: soft and non-tender; no masses, no  organomegaly, normal abdominal bowel sounds; no pannus; no intertriginous candida. There is no rebound and no distention. Rectal Exam: Not done EXTREMITIES: No bone or joint deformity; age-appropriate arthropathy of the hands and knees; no edema; no ulcerations.  There is no calf tenderness. Genitalia: not examined PULSES: 2+ and symmetric SKIN: Normal hydration no rash or ulceration CNS: Cranial nerves 2-12 grossly intact no focal lateralizing neurologic deficit.  Speech is fluent; uvula elevated with phonation, facial symmetry and tongue midline. DTR are normal bilaterally, cerebella exam is intact, barbinski is negative and strengths are equaled bilaterally.  No sensory loss.   Labs on Admission:  Basic Metabolic Panel:  Recent Labs Lab 11/13/14 1745 11/14/14 0600  NA 138 140  K 3.6 3.7  CL 101 107  CO2 28 25  GLUCOSE 140* 92  BUN 19 16  CREATININE 0.68 0.60  CALCIUM 9.3 8.6*   Liver Function Tests:  Recent Labs Lab 11/13/14 1745 11/14/14 0600  AST 466* 204*  ALT 244* 180*  ALKPHOS 174* 133*  BILITOT 1.9* 3.6*  PROT 7.4 6.5  ALBUMIN 3.4* 2.9*    Recent Labs Lab 11/13/14 1745  LIPASE 23   CBC:  Recent Labs Lab 11/13/14 1745 11/14/14 0600  WBC 8.5 14.0*  NEUTROABS 8.2* 12.9*  HGB 11.9* 10.7*  HCT 36.2 33.0*  MCV 97.1 97.1  PLT 112* 100*   Radiological Exams on Admission: Ct Abdomen Pelvis W Contrast  11/13/2014   CLINICAL DATA:  Acute onset of fever and vomiting. Initial encounter.  EXAM: CT ABDOMEN AND PELVIS WITH CONTRAST  TECHNIQUE: Multidetector CT imaging of the abdomen and pelvis was performed using the standard protocol following bolus administration of intravenous contrast.  CONTRAST:  OMNIPAQUE IOHEXOL 300 MG/ML  SOLN  COMPARISON:  CT of the abdomen and pelvis from 10/11/2014  FINDINGS: Mild focal right basilar airspace opacity raises concern for pneumonia.  There is suggestion of mild intrahepatic biliary ductal dilatation; this is only  minimally more prominent than on the prior study. The common bile duct measures up to 1.0 cm in diameter, mildly dilated for age. Trace pericholecystic fluid is noted. This could reflect distal obstruction. The liver and spleen are otherwise unremarkable. The pancreas is grossly unremarkable in appearance. The adrenal glands are grossly unremarkable.  A small 7 mm cyst is noted at the upper pole of the right kidney. The kidneys are otherwise unremarkable. There is no evidence of hydronephrosis. No renal or ureteral Reynolds are seen. No perinephric stranding is appreciated.  No free fluid is identified. The small bowel is unremarkable in appearance. The stomach is within normal limits. No acute vascular abnormalities are seen. Scattered calcification is noted along the abdominal aorta and its branches. Mild surrounding retroperitoneal edema is noted, of uncertain significance, with associated nodes measuring up to 8  mm in short axis. There is an unusual collection of calcified nodes within the mesentery at the left mid abdomen, possibly reflecting remote granulomatous disease, unchanged from the recent prior study.  The appendix is not definitely characterized; there is no evidence for appendicitis. Contrast progresses to the level of the proximal transverse colon. The colon is largely filled with stool, and the rectum is distended to 9.4 cm in transverse dimension with stool, raising concern for constipation and mild fecal impaction.  The bladder is mildly distended and grossly unremarkable. The patient is status post hysterectomy. No suspicious adnexal masses are seen. No inguinal lymphadenopathy is seen.  No acute osseous abnormalities are identified. Facet disease is noted along the lumbar spine.  IMPRESSION: 1. Mild focal right basilar airspace opacity raises concern for pneumonia. 2. Suggestion of mild intrahepatic biliary ductal dilatation, only minimally more prominent than on the prior study. Common bile duct  measures up to 1.0 cm in diameter, mildly dilated for age. Trace pericholecystic fluid noted. This could reflect mild intermittent distal obstruction, though the patient's bilirubin has decreased since May. If the patient's symptoms persist, MRCP or ERCP could be considered for further evaluation. 3. Colon largely filled with stool. Rectum distended to 9.4 cm in transverse dimension with stool, raising concern for constipation and mild fecal impaction. 4. Mild retroperitoneal edema is somewhat more prominent than on the prior study and of uncertain significance, with associated nodes measuring up to 8 mm in short axis, relatively stable in size. 5. Unusual collection of calcified nodes within the mesentery at the left mid abdomen, possibly reflecting remote granulomatous disease. Sequelae of prior tuberculosis infection could have a similar appearance; would correlate with the patient's history. 6. Scattered calcification along the abdominal aorta and its branches. 7. Small right renal cyst noted.   Electronically Signed   By: Roanna Raider M.D.   On: 11/13/2014 21:50   Dg Chest Port 1 View  11/13/2014   CLINICAL DATA:  Code sepsis, fever  EXAM: PORTABLE CHEST - 1 VIEW  COMPARISON:  10/11/2014  FINDINGS: Increased interstitial markings. No focal consolidation. No pleural effusion or pneumothorax.  The heart is normal in size.  IMPRESSION: No evidence of acute cardiopulmonary disease.   Electronically Signed   By: Charline Bills M.D.   On: 11/13/2014 19:04   Assessment/Plan Present on Admission:  . Sepsis . Transaminitis . Nausea & vomiting . Alzheimer's dementia . Nonverbal . QT prolongation  PLAN:  Will continue with her Van/Zosyn, follow up on BC of the GNR.   She appears to be better today.  Will transfer her to telemetry today.   Her elevated LFT with minimal dilatation of the biliary tree, with infection, queried acute cholecystitis, but I don't think this is the source.  GI has been  consulted.  She is on no statin.  Will obtain monospot and acute hepatitis panel. Her QTc prolongation has corrected.  It is less than 500 ms today.  She will continue to be DNR.    Other plans as per orders.  Code Status: DNR.    Houston Siren, MD. [facility] Pager [phone] 7pm to 7am.  11/14/2014, 10:08 AM

## 2014-11-14 NOTE — Progress Notes (Signed)
Pt alert.vss. No s/s of any distress. Pt resting in bed. Sister at the bedside. Report given to Beverly Gust, RN. Pt transferred to dept 300.

## 2014-11-14 NOTE — Care Management Note (Signed)
Case Management Note  Patient Details  Name: Lethia Hollway MRN: [id number] Date of Birth: Apr 15, 1929  Subjective/Objective:                  Pt admitted from [facility] with sepsis. Anticipate discharge back to facility when medically stable.  Action/Plan: CSW is aware and will arrange discharge to facility when medically stable.  Expected Discharge Date:  11/18/14               Expected Discharge Plan:  Assisted Living / Rest Home  In-House Referral:  Clinical Social Work  Discharge planning Services  CM Consult  Post Acute Care Choice:    Choice offered to:     DME Arranged:    DME Agency:     HH Arranged:    HH Agency:     Status of Service:  In process, will continue to follow  Medicare Important Message Given:    Date Medicare IM Given:    Medicare IM give by:    Date Additional Medicare IM Given:    Additional Medicare Important Message give by:     If discussed at Long Length of Stay Meetings, dates discussed:    Additional Comments:  Cheryl Flash, RN 11/14/2014, 10:35 AM

## 2014-11-14 NOTE — Consult Note (Signed)
Referring Provider: No ref. provider found Primary Care Physician:  [facility] Primary Gastroenterologist:  None in the system  Date of Admission: 11/13/14 Date of Consultation: 11/14/11  Reason for Consultation:  Transaminitis  HPI:  81 year old female with a PMH of GERD presented to teh ER with high fever and was admitted for sepsis likely due to pneumonia. Blood cultures 3/4 positive. She is baseline non-verbal. Did have an episode of vomiting but no diarrhea. Temp on admission was 103. CT infiltrate at the right base, on antibiotics and IVF.  Noted transaminitis on admission AST/ALT 466/244, Alk Phos 174, Bili 1.9. CT abdomen/pelvis with contrast showed mild intrahepatic biliary ductal dilation with CBD 1.0 cm, trace pericholecystic fluid noted, question partial obstruction. Liver, spleen, pancreas otherwise unremarkable. She did have a bump in her LFTs about 1 month ago as well and US abdomen showed a single 7 mm gallstone with mild gallbladder wall thickening and no pericholecystic fluid. On admission her abdominal exam was benign.  Patient is poor historian due to essentially non-verbal status. She mumbles but is not coherent and family is not currently at bedside. She did, on three occasions, say "um hum" indicating yes when asked if her abdomen was hurting and a "ut-uh" presumably no when asked if her abdomen hurt worse with palpation. No other information was able to be obtained.  Past Medical History  Diagnosis Date  . HTN (hypertension)   . Hyperlipemia   . Seizures   . GERD (gastroesophageal reflux disease)   . Alzheimer's dementia   . Insomnia   . Vitamin D deficiency   . Schizoaffective disorder     Past Surgical History  Procedure Laterality Date  . Stomach surgery      Prior to Admission medications   Medication Sig Start Date End Date Taking? Authorizing Provider  amLODipine (NORVASC) 5 MG tablet Take 1 tablet (5 mg total) by mouth  daily. 10/13/14  Yes Kelvin Cellar, MD  aspirin EC 81 MG tablet Take 81 mg by mouth daily.   Yes Historical Provider, MD  benztropine (COGENTIN) 0.5 MG tablet Take 0.5 mg by mouth 2 (two) times daily.   Yes Historical Provider, MD  cholecalciferol (VITAMIN D) 1000 UNITS tablet Take 1,000 Units by mouth daily.   Yes Historical Provider, MD  clonazePAM (KLONOPIN) 0.5 MG tablet Take 0.5 mg by mouth 2 (two) times daily.   Yes Historical Provider, MD  famotidine (PEPCID) 20 MG tablet Take 20 mg by mouth at bedtime.   Yes Historical Provider, MD  feeding supplement, ENSURE ENLIVE, (ENSURE ENLIVE) LIQD Take 237 mLs by mouth daily. 10/13/14  Yes Kelvin Cellar, MD  hydrOXYzine (VISTARIL) 25 MG capsule Take 25 mg by mouth at bedtime.   Yes Historical Provider, MD  lactulose (CHRONULAC) 10 GM/15ML solution Take 20 g by mouth 2 (two) times daily.   Yes Historical Provider, MD  polyethylene glycol powder (GLYCOLAX/MIRALAX) powder Take 17 g by mouth daily as needed for mild constipation or moderate constipation (*Mix in 8 oz of water and drink as needed to have a bowel movement every 3 days.).   Yes Historical Provider, MD  simvastatin (ZOCOR) 40 MG tablet Take 40 mg by mouth at bedtime.   Yes Historical Provider, MD  traZODone (DESYREL) 100 MG tablet Take 200 mg by mouth at bedtime.   Yes Historical Provider, MD  ziprasidone (GEODON) 40 MG capsule Take 40 mg by mouth daily.    Yes Historical Provider, MD  Current Facility-Administered Medications  Medication Dose Route Frequency Provider Last Rate Last Dose  . 0.9 %  sodium chloride infusion   Intravenous Continuous Phillips Grout, MD 100 mL/hr at 11/14/14 0108    . aspirin EC tablet 81 mg  81 mg Oral Daily Phillips Grout, MD   81 mg at 11/14/14 0900  . enoxaparin (LOVENOX) injection 40 mg  40 mg Subcutaneous Q24H Phillips Grout, MD   40 mg at 11/14/14 0900  . piperacillin-tazobactam (ZOSYN) IVPB 3.375 g  3.375 g Intravenous Q8H Kristen N Ward, DO   3.375  g at 11/14/14 0333  . traZODone (DESYREL) tablet 200 mg  200 mg Oral QHS Phillips Grout, MD   200 mg at 11/14/14 0110  . vancomycin (VANCOCIN) IVPB 750 mg/150 ml premix  750 mg Intravenous Q24H Kristen N Ward, DO      . ziprasidone (GEODON) capsule 40 mg  40 mg Oral Daily Phillips Grout, MD        Allergies as of 11/13/2014  . (No Known Allergies)    History reviewed. No pertinent family history.  History   Social History  . Marital Status: Widowed    Spouse Name: N/A  . Number of Children: N/A  . Years of Education: N/A   Occupational History  . Not on file.   Social History Main Topics  . Smoking status: Never Smoker   . Smokeless tobacco: Not on file  . Alcohol Use: No  . Drug Use: No  . Sexual Activity: No   Other Topics Concern  . Not on file   Social History Narrative    Review of Systems: Negative except as per HPI as patient is non-verbal  Physical Exam: Vital signs in last 24 hours: Temp:  [98.3 F (36.8 C)-103.5 F (39.7 C)] 98.5 F (36.9 C) (06/14 0400) Pulse Rate:  [104-129] 104 (06/14 0700) Resp:  [16-25] 22 (06/14 0700) BP: (83-152)/(44-112) 93/54 mmHg (06/14 0700) SpO2:  [90 %-100 %] 100 % (06/14 0700) Weight:  [107 lb (48.535 kg)] 107 lb (48.535 kg) (06/13 2346) Last BM Date: 11/13/14 General:   Alert,  Well-developed, well-nourished, mumbling incoherently but not agitated. Head:  Normocephalic and atraumatic. Eyes:  Sclera clear, no icterus. Conjunctiva pink. Ears:  Normal auditory acuity. Lungs:  Clear throughout to auscultation though difficult to fully hear due to on/off verbal sounds.  No wheezes, crackles, or rhonchi. No acute distress. Heart:  Regular rate and rhythm; no murmurs, clicks, rubs,  or gallops  though difficult to fully hear due to on/off verbal sounds. Abdomen:  Soft and nondistended. Tenderness as noted in HPI. No masses, hepatosplenomegaly or hernias noted. Normal bowel sounds, without guarding, and without rebound.    Rectal:  Deferred.   Pulses:  Normal DP pulses noted. Extremities:  Without clubbing or edema. Neurologic:  Alert, unable to assess orientation;  Non-verbal.  Intake/Output from previous day: 06/13 0701 - 06/14 0700 In: 356.7 [P.O.:120; I.V.:186.7; IV Piggyback:50] Out: -  Intake/Output this shift:    Lab Results:  Recent Labs  11/13/14 1745 11/14/14 0600  WBC 8.5 14.0*  HGB 11.9* 10.7*  HCT 36.2 33.0*  PLT 112* 100*   BMET  Recent Labs  11/13/14 1745 11/14/14 0600  NA 138 140  K 3.6 3.7  CL 101 107  CO2 28 25  GLUCOSE 140* 92  BUN 19 16  CREATININE 0.68 0.60  CALCIUM 9.3 8.6*   LFT  Recent Labs  11/13/14 1745 11/14/14 0600  PROT 7.4 6.5  ALBUMIN 3.4* 2.9*  AST 466* 204*  ALT 244* 180*  ALKPHOS 174* 133*  BILITOT 1.9* 3.6*  BILIDIR  --  2.4*  IBILI  --  1.2*   PT/INR  Recent Labs  11/14/14 0108  LABPROT 15.5*  INR 1.21   Hepatitis Panel No results for input(s): HEPBSAG, HCVAB, HEPAIGM, HEPBIGM in the last 72 hours. C-Diff No results for input(s): CDIFFTOX in the last 72 hours.  Studies/Results: Ct Abdomen Pelvis W Contrast  11/13/2014   CLINICAL DATA:  Acute onset of fever and vomiting. Initial encounter.  EXAM: CT ABDOMEN AND PELVIS WITH CONTRAST  TECHNIQUE: Multidetector CT imaging of the abdomen and pelvis was performed using the standard protocol following bolus administration of intravenous contrast.  CONTRAST:  190m OMNIPAQUE IOHEXOL 300 MG/ML  SOLN  COMPARISON:  CT of the abdomen and pelvis from 10/11/2014  FINDINGS: Mild focal right basilar airspace opacity raises concern for pneumonia.  There is suggestion of mild intrahepatic biliary ductal dilatation; this is only minimally more prominent than on the prior study. The common bile duct measures up to 1.0 cm in diameter, mildly dilated for age. Trace pericholecystic fluid is noted. This could reflect distal obstruction. The liver and spleen are otherwise unremarkable. The pancreas is  grossly unremarkable in appearance. The adrenal glands are grossly unremarkable.  A small 7 mm cyst is noted at the upper pole of the right kidney. The kidneys are otherwise unremarkable. There is no evidence of hydronephrosis. No renal or ureteral stones are seen. No perinephric stranding is appreciated.  No free fluid is identified. The small bowel is unremarkable in appearance. The stomach is within normal limits. No acute vascular abnormalities are seen. Scattered calcification is noted along the abdominal aorta and its branches. Mild surrounding retroperitoneal edema is noted, of uncertain significance, with associated nodes measuring up to 8 mm in short axis. There is an unusual collection of calcified nodes within the mesentery at the left mid abdomen, possibly reflecting remote granulomatous disease, unchanged from the recent prior study.  The appendix is not definitely characterized; there is no evidence for appendicitis. Contrast progresses to the level of the proximal transverse colon. The colon is largely filled with stool, and the rectum is distended to 9.4 cm in transverse dimension with stool, raising concern for constipation and mild fecal impaction.  The bladder is mildly distended and grossly unremarkable. The patient is status post hysterectomy. No suspicious adnexal masses are seen. No inguinal lymphadenopathy is seen.  No acute osseous abnormalities are identified. Facet disease is noted along the lumbar spine.  IMPRESSION: 1. Mild focal right basilar airspace opacity raises concern for pneumonia. 2. Suggestion of mild intrahepatic biliary ductal dilatation, only minimally more prominent than on the prior study. Common bile duct measures up to 1.0 cm in diameter, mildly dilated for age. Trace pericholecystic fluid noted. This could reflect mild intermittent distal obstruction, though the patient's bilirubin has decreased since May. If the patient's symptoms persist, MRCP or ERCP could be  considered for further evaluation. 3. Colon largely filled with stool. Rectum distended to 9.4 cm in transverse dimension with stool, raising concern for constipation and mild fecal impaction. 4. Mild retroperitoneal edema is somewhat more prominent than on the prior study and of uncertain significance, with associated nodes measuring up to 8 mm in short axis, relatively stable in size. 5. Unusual collection of calcified nodes within the mesentery at the left mid abdomen, possibly reflecting remote granulomatous disease. Sequelae of prior  tuberculosis infection could have a similar appearance; would correlate with the patient's history. 6. Scattered calcification along the abdominal aorta and its branches. 7. Small right renal cyst noted.   Electronically Signed   By: Garald Balding M.D.   On: 11/13/2014 21:50   Dg Chest Port 1 View  11/13/2014   CLINICAL DATA:  Code sepsis, fever  EXAM: PORTABLE CHEST - 1 VIEW  COMPARISON:  10/11/2014  FINDINGS: Increased interstitial markings. No focal consolidation. No pleural effusion or pneumothorax.  The heart is normal in size.  IMPRESSION: No evidence of acute cardiopulmonary disease.   Electronically Signed   By: Julian Hy M.D.   On: 11/13/2014 19:04    Impression: 81 year old female admitted for sepsis with a recurrent transaminitis. Last increase in LFTs was a month ago with a single 7 mm gallstone demonstrated on abdominal US. Currently her CT shows mild intrahepatic biliary ductal dilation with CBD 1.0 cm and there is a question of partial obstruction. She did have some vomiting, but no abdominal pain.    Today she is still non-verbal as per sister's indication of her baseline. She does seem to admits abdominal pain, no worse with palpation. Noted episode of vomiting yesterday and none since. Given symptoms, acute elevation in LFTs, CT indication of mild CBD dilation, previous LFT increase one month ago with 7 mm gallstone on [place] she is likely having  partial CBD obstruction. Unlikely she would tolerate being able to lie still for MRCP and doubtful she is candidate for ERCP given her current clinical state.  Plan: 1. Continue supportive measures 2. Follow liver function, hepatic function test tomorrow 3. MRCP vs ERCP for further evaluation of bile ducts if/when she is a candidate, will discuss with Dr. Oneida Alar 4. BID PPI for GI prophylaxis especially given GERD history    Walden Field, AGNP-C Adult & Gerontological Nurse Practitioner [facility]    LOS: 1 day     11/14/2014, 8:17 AM

## 2014-11-14 NOTE — Progress Notes (Signed)
Initial Nutrition Assessment  DOCUMENTATION CODES:  Severe malnutrition in context of chronic illness, Underweight  INTERVENTION:  Ensure Enlive (each supplement provides 350kcal and 20 grams of protein), Magic cup TID  NUTRITION DIAGNOSIS:  Malnutrition related to chronic illness as evidenced by severe depletion of body fat, severe depletion of muscle mass.  GOAL:  Patient will meet greater than or equal to 90% of their needs  MONITOR:  PO intake, Supplement acceptance, Labs, Weight trends, I & O's  REASON FOR ASSESSMENT:  Other (Comment) (Low BMI)    ASSESSMENT: 81 yo female h/o dementia, nonverbal at baseline since childhood, htn, GERD. Comes in from PCP office with report of temp of 106  Pt verbalizing sounds, but unable to understand what is being said. Per MD notes, pt is non-verbal at baseline, and lives in a group home. No family at bedside to obtain history, no PO since admission has been recorded. Nutrition focused physical exam showed severe muscle and fat mass depletion, Ensure has been ordered TID. Will continue to monitor. Labs reviewed: Glu 92 - 140, Ca 8.6  Height:  Ht Readings from Last 1 Encounters:  11/13/14 5\' 8"  (1.727 m)    Weight:  Wt Readings from Last 1 Encounters:  11/13/14 107 lb (48.535 kg)    Ideal Body Weight:  63.6 kg  Wt Readings from Last 10 Encounters:  11/13/14 107 lb (48.535 kg)  10/12/14 107 lb 12.9 oz (48.9 kg)  04/29/14 120 lb (54.432 kg)    BMI:  Body mass index is 16.27 kg/(m^2).  Estimated Nutritional Needs:  Kcal:  1500 - 1700  Protein:  75 - 85 g  Fluid:  > 1.7 L  Skin:  Reviewed, no issues  Diet Order:  DIET DYS 2 Room service appropriate?: Yes; Fluid consistency:: Thin  EDUCATION NEEDS:  Education needs no appropriate at this time   Intake/Output Summary (Last 24 hours) at 11/14/14 1043 Last data filed at 11/14/14 0400  Gross per 24 hour  Intake 356.67 ml  Output      0 ml  Net 356.67 ml     Last BM:  6/13  Jaleea Alesi A. [facility] Dietetic Intern Pager: [phone] 11/14/2014 10:43 AM

## 2014-11-14 NOTE — Progress Notes (Signed)
Gram negative rods in 3 of 4 blood culture bottles.  MD made aware. Pt is currently on vancomycin and zosyn IV abx.

## 2014-11-14 NOTE — Progress Notes (Signed)
UR chart review completed.  

## 2014-11-15 DIAGNOSIS — A419 Sepsis, unspecified organism: Secondary | ICD-10-CM | POA: Diagnosis present

## 2014-11-15 DIAGNOSIS — K5900 Constipation, unspecified: Secondary | ICD-10-CM | POA: Insufficient documentation

## 2014-11-15 DIAGNOSIS — K5641 Fecal impaction: Secondary | ICD-10-CM | POA: Insufficient documentation

## 2014-11-15 DIAGNOSIS — F0280 Dementia in other diseases classified elsewhere without behavioral disturbance: Secondary | ICD-10-CM

## 2014-11-15 DIAGNOSIS — D696 Thrombocytopenia, unspecified: Secondary | ICD-10-CM | POA: Diagnosis present

## 2014-11-15 DIAGNOSIS — A4151 Sepsis due to Escherichia coli [E. coli]: Principal | ICD-10-CM

## 2014-11-15 DIAGNOSIS — G309 Alzheimer's disease, unspecified: Secondary | ICD-10-CM

## 2014-11-15 LAB — URINE CULTURE
Colony Count: NO GROWTH
Culture: NO GROWTH

## 2014-11-15 LAB — BASIC METABOLIC PANEL
ANION GAP: 6 (ref 5–15)
BUN: 26 mg/dL — ABNORMAL HIGH (ref 6–20)
CO2: 25 mmol/L (ref 22–32)
Calcium: 8.2 mg/dL — ABNORMAL LOW (ref 8.9–10.3)
Chloride: 107 mmol/L (ref 101–111)
Creatinine, Ser: 0.84 mg/dL (ref 0.44–1.00)
Glucose, Bld: 92 mg/dL (ref 65–99)
Potassium: 3.5 mmol/L (ref 3.5–5.1)
Sodium: 138 mmol/L (ref 135–145)

## 2014-11-15 LAB — HEPATITIS PANEL, ACUTE
HCV AB: 0.2 {s_co_ratio} (ref 0.0–0.9)
HEP A IGM: NEGATIVE
HEP B C IGM: NEGATIVE
Hepatitis B Surface Ag: NEGATIVE

## 2014-11-15 LAB — CBC
HCT: 33.8 % — ABNORMAL LOW (ref 36.0–46.0)
HEMOGLOBIN: 11.1 g/dL — AB (ref 12.0–15.0)
MCH: 31.4 pg (ref 26.0–34.0)
MCHC: 32.8 g/dL (ref 30.0–36.0)
MCV: 95.5 fL (ref 78.0–100.0)
PLATELETS: 82 10*3/uL — AB (ref 150–400)
RBC: 3.54 MIL/uL — ABNORMAL LOW (ref 3.87–5.11)
RDW: 14.5 % (ref 11.5–15.5)
WBC: 16.3 10*3/uL — ABNORMAL HIGH (ref 4.0–10.5)

## 2014-11-15 LAB — HEPATIC FUNCTION PANEL
ALT: 110 U/L — ABNORMAL HIGH (ref 14–54)
AST: 91 U/L — AB (ref 15–41)
Albumin: 2.5 g/dL — ABNORMAL LOW (ref 3.5–5.0)
Alkaline Phosphatase: 94 U/L (ref 38–126)
Bilirubin, Direct: 1.1 mg/dL — ABNORMAL HIGH (ref 0.1–0.5)
Indirect Bilirubin: 0.9 mg/dL (ref 0.3–0.9)
TOTAL PROTEIN: 5.9 g/dL — AB (ref 6.5–8.1)
Total Bilirubin: 2 mg/dL — ABNORMAL HIGH (ref 0.3–1.2)

## 2014-11-15 MED ORDER — POLYETHYLENE GLYCOL 3350 17 G PO PACK
17.0000 g | PACK | ORAL | Status: AC
  Administered 2014-11-15 (×3): 17 g via ORAL
  Filled 2014-11-15 (×3): qty 1

## 2014-11-15 MED ORDER — MILK AND MOLASSES ENEMA
1.0000 | Freq: Once | RECTAL | Status: AC
  Administered 2014-11-15: 250 mL via RECTAL

## 2014-11-15 MED ORDER — POLYETHYLENE GLYCOL 3350 17 G PO PACK
17.0000 g | PACK | ORAL | Status: AC
  Filled 2014-11-15: qty 1

## 2014-11-15 MED ORDER — BISACODYL 5 MG PO TBEC
10.0000 mg | DELAYED_RELEASE_TABLET | Freq: Once | ORAL | Status: AC
  Administered 2014-11-15: 10 mg via ORAL
  Filled 2014-11-15: qty 2

## 2014-11-15 NOTE — Progress Notes (Signed)
Subjective:  Sister at bedside. Patient resting. Noted to have distended rectum on CT with large stool burden. Per sister, patient has a lot of trouble with constipation.   Objective: Vital signs in last 24 hours: Temp:  [97.8 F (36.6 C)-103.5 F (39.7 C)] 98.6 F (37 C) (06/15 0643) Pulse Rate:  [34-100] 100 (06/15 0643) Resp:  [13-25] 20 (06/15 0643) BP: (93-140)/(57-94) 131/67 mmHg (06/15 0643) SpO2:  [98 %-100 %] 98 % (06/15 0643) Weight:  [107 lb (48.535 kg)] 107 lb (48.535 kg) (06/14 1514) Last BM Date: 11/13/14 General:   Alert,  Well-developed, well-nourished, pleasant and cooperative in NAD Head:  Normocephalic and atraumatic. Eyes:  Sclera clear, no icterus.  Abdomen:  Soft, distended in lower abdomen. nontender (no grimacing). Normal bowel sounds, without guarding, and without rebound.   Rectal: fecal impaction noted. Large amount of softer stool removed but significant amount remaining post-impaction. Extremities:  Without clubbing, deformity or edema. Neurologic:  Alert and  oriented x4;  grossly normal neurologically. Skin:  Intact without significant lesions or rashes. Psych:  Alert and cooperative. Normal mood and affect.  Intake/Output from previous day: 06/14 0701 - 06/15 0700 In: 2700 [I.V.:2400; IV Piggyback:300] Out: -  Intake/Output this shift:    Lab Results: CBC  Recent Labs  11/13/14 1745 11/14/14 0600 11/15/14 0639  WBC 8.5 14.0* 16.3*  HGB 11.9* 10.7* 11.1*  HCT 36.2 33.0* 33.8*  MCV 97.1 97.1 95.5  PLT 112* 100* 82*   BMET  Recent Labs  11/13/14 1745 11/14/14 0600 11/15/14 0639  NA 138 140 138  K 3.6 3.7 3.5  CL 101 107 107  CO2 GLUCOSE 140* 92 92  BUN 19 16 26*  CREATININE 0.68 0.60 0.84  CALCIUM 9.3 8.6* 8.2*   LFTs  Recent Labs  11/13/14 1745 11/14/14 0600 11/15/14 0639  BILITOT 1.9* 3.6* 2.0*  BILIDIR  --  2.4* 1.1*  IBILI  --  1.2* 0.9  ALKPHOS 174* 133* 94  AST 466* 204* 91*  ALT 244* 180* 110*   PROT 7.4 6.5 5.9*  ALBUMIN 3.4* 2.9* 2.5*    Recent Labs  11/13/14 1745  LIPASE 23   PT/INR  Recent Labs  11/14/14 0108  LABPROT 15.5*  INR 1.21   HCV Ab neg Hep B surf Ag neg Hep A IgM neg Hep B C IgM neg     Imaging Studies: Ct Abdomen Pelvis W Contrast  11/13/2014   CLINICAL DATA:  Acute onset of fever and vomiting. Initial encounter.  EXAM: CT ABDOMEN AND PELVIS WITH CONTRAST  TECHNIQUE: Multidetector CT imaging of the abdomen and pelvis was performed using the standard protocol following bolus administration of intravenous contrast.  CONTRAST:  OMNIPAQUE IOHEXOL 300 MG/ML  SOLN  COMPARISON:  CT of the abdomen and pelvis from 10/11/2014  FINDINGS: Mild focal right basilar airspace opacity raises concern for pneumonia.  There is suggestion of mild intrahepatic biliary ductal dilatation; this is only minimally more prominent than on the prior study. The common bile duct measures up to 1.0 cm in diameter, mildly dilated for age. Trace pericholecystic fluid is noted. This could reflect distal obstruction. The liver and spleen are otherwise unremarkable. The pancreas is grossly unremarkable in appearance. The adrenal glands are grossly unremarkable.  A small 7 mm cyst is noted at the upper pole of the right kidney. The kidneys are otherwise unremarkable. There is no evidence of hydronephrosis. No renal or ureteral stones are seen. No perinephric stranding  is appreciated.  No free fluid is identified. The small bowel is unremarkable in appearance. The stomach is within normal limits. No acute vascular abnormalities are seen. Scattered calcification is noted along the abdominal aorta and its branches. Mild surrounding retroperitoneal edema is noted, of uncertain significance, with associated nodes measuring up to 8 mm in short axis. There is an unusual collection of calcified nodes within the mesentery at the left mid abdomen, possibly reflecting remote granulomatous disease, unchanged  from the recent prior study.  The appendix is not definitely characterized; there is no evidence for appendicitis. Contrast progresses to the level of the proximal transverse colon. The colon is largely filled with stool, and the rectum is distended to 9.4 cm in transverse dimension with stool, raising concern for constipation and mild fecal impaction.  The bladder is mildly distended and grossly unremarkable. The patient is status post hysterectomy. No suspicious adnexal masses are seen. No inguinal lymphadenopathy is seen.  No acute osseous abnormalities are identified. Facet disease is noted along the lumbar spine.  IMPRESSION: 1. Mild focal right basilar airspace opacity raises concern for pneumonia. 2. Suggestion of mild intrahepatic biliary ductal dilatation, only minimally more prominent than on the prior study. Common bile duct measures up to 1.0 cm in diameter, mildly dilated for age. Trace pericholecystic fluid noted. This could reflect mild intermittent distal obstruction, though the patient's bilirubin has decreased since May. If the patient's symptoms persist, MRCP or ERCP could be considered for further evaluation. 3. Colon largely filled with stool. Rectum distended to 9.4 cm in transverse dimension with stool, raising concern for constipation and mild fecal impaction. 4. Mild retroperitoneal edema is somewhat more prominent than on the prior study and of uncertain significance, with associated nodes measuring up to 8 mm in short axis, relatively stable in size. 5. Unusual collection of calcified nodes within the mesentery at the left mid abdomen, possibly reflecting remote granulomatous disease. Sequelae of prior tuberculosis infection could have a similar appearance; would correlate with the patient's history. 6. Scattered calcification along the abdominal aorta and its branches. 7. Small right renal cyst noted.   Electronically Signed   By: Roanna Raider M.D.   On: 11/13/2014 21:50   Mr 3d Recon  At Scanner  11/14/2014   CLINICAL DATA:  Sepsis with hepatic failure. Patient is confused and could not follow peripheral commands.  EXAM: MRI ABDOMEN WITHOUT AND WITH CONTRAST (INCLUDING MRCP)  TECHNIQUE: Multiplanar multisequence MR imaging of the abdomen was performed both before and after the administration of intravenous contrast. Heavily T2-weighted images of the biliary and pancreatic ducts were obtained, and three-dimensional MRCP images were rendered by post processing.  CONTRAST:  6mL MULTIHANCE GADOBENATE DIMEGLUMINE 529 MG/ML IV SOLN  COMPARISON:  CT 11/13/2014  FINDINGS: Lower chest:  Small bilateral pleural effusions.  Hepatobiliary: No intrahepatic biliary duct dilatation. The MRCP sequences are severely degraded by patient respiratory motion. Patient was confused and could not follow voice commands which is required for MRI imaging. No gross biliary abnormality.  Pancreas: Normal pancreatic parenchymal intensity. No ductal dilatation or inflammation.  Spleen: Normal spleen.  Adrenals/urinary tract: Adrenal glands and kidneys are normal.  Stomach/Bowel: Stomach and limited of the small bowel is unremarkable  Vascular/Lymphatic: Abdominal aortic normal caliber. No retroperitoneal periportal lymphadenopathy.  Musculoskeletal: No aggressive osseous lesion  IMPRESSION: 1. No hepatic abnormality.  No biliary duct dilatation. 2. MRCP sequences are limited due to patient inability to follow commands. No gross biliary abnormality. 3. Small bilateral pleural effusions.  Electronically Signed   By: Genevive Bi M.D.   On: 11/14/2014 15:51   Dg Chest Port 1 View  11/13/2014   CLINICAL DATA:  Code sepsis, fever  EXAM: PORTABLE CHEST - 1 VIEW  COMPARISON:  10/11/2014  FINDINGS: Increased interstitial markings. No focal consolidation. No pleural effusion or pneumothorax.  The heart is normal in size.  IMPRESSION: No evidence of acute cardiopulmonary disease.   Electronically Signed   By: Charline Bills  M.D.   On: 11/13/2014 19:04   Mr Abd W/wo Cm/mrcp  11/14/2014   CLINICAL DATA:  Sepsis with hepatic failure. Patient is confused and could not follow peripheral commands.  EXAM: MRI ABDOMEN WITHOUT AND WITH CONTRAST (INCLUDING MRCP)  TECHNIQUE: Multiplanar multisequence MR imaging of the abdomen was performed both before and after the administration of intravenous contrast. Heavily T2-weighted images of the biliary and pancreatic ducts were obtained, and three-dimensional MRCP images were rendered by post processing.  CONTRAST:  10mL MULTIHANCE GADOBENATE DIMEGLUMINE 529 MG/ML IV SOLN  COMPARISON:  CT 11/13/2014  FINDINGS: Lower chest:  Small bilateral pleural effusions.  Hepatobiliary: No intrahepatic biliary duct dilatation. The MRCP sequences are severely degraded by patient respiratory motion. Patient was confused and could not follow voice commands which is required for MRI imaging. No gross biliary abnormality.  Pancreas: Normal pancreatic parenchymal intensity. No ductal dilatation or inflammation.  Spleen: Normal spleen.  Adrenals/urinary tract: Adrenal glands and kidneys are normal.  Stomach/Bowel: Stomach and limited of the small bowel is unremarkable  Vascular/Lymphatic: Abdominal aortic normal caliber. No retroperitoneal periportal lymphadenopathy.  Musculoskeletal: No aggressive osseous lesion  IMPRESSION: 1. No hepatic abnormality.  No biliary duct dilatation. 2. MRCP sequences are limited due to patient inability to follow commands. No gross biliary abnormality. 3. Small bilateral pleural effusions.   Electronically Signed   By: Genevive Bi M.D.   On: 11/14/2014 15:51  [2 weeks]   Assessment: 81 year old female admitted for sepsis (E.coli) with a recurrent transaminitis. Last increase in LFTs was a month ago with a single 7 mm gallstone demonstrated on abdominal US. Currently her CT shows mild intrahepatic biliary ductal dilation with CBD 1.0 cm and there is a question of partial  obstruction. She did have some vomiting, but no abdominal pain.   MRCP limited as patient could not hold breath for exam. No gross biliary abnormality. Cannot exclude partial CBD obstruction or passage of stone. She was admitted for 16 days last month with E.coli bacteremia. No urine culture done at that time. Her LFTs were elevated then as well.  LFTs improving today. Worsening leukocytosis.   Current CT shows persisting large volume of colonic stool. Rectum distended to 9.4cm in transverse dimension with stool. S/p partial disimpaction today.  Plan: 1. Enemas and bowel regimen today.  2. Dr. Darrick Penna to discuss with ID today.   Leanna Battles. [facility] [phone] 6/15/20169:22 AM     LOS: 2 days

## 2014-11-15 NOTE — Progress Notes (Signed)
[facility] PROGRESS NOTE  [name] Reynolds WUJ:[id number] DOB: August 26, 1928 DOA: 11/13/2014 PCP: [facility]  Assessment/Plan: 1. Sepsis due to Escherichia coli. Patient has positive blood cultures for Escherichia coli. Source is thought to be biliary tree. Currently, she does not have any evidence of cholecystitis. GI is following. Continue current antibiotic and follow-up culture sensitivities. She will need a total of 2 weeks of antibiotic. Plans are for further outpatient GI workup such as EUS, possible ERCP. urine culture has shown no growth. 2. Elevated LFTs. Likely related to underlying sepsis. Continue to follow. 3. Thrombocytopenia. Likely related to sepsis. Continue to follow. Hold Lovenox and use SCDs 4. Fecal impaction. Being followed by GI. Started on enemas and bowel regimen. 5. Advanced Alzheimer's dementia.  Code Status: DNR Family Communication: discharge home once improved Disposition Plan: discharge home once improved   Consultants:  gastroenterology  Procedures:    Antibiotics:  zosyn  HPI/Subjective: Confused, denies any complaints  Objective: Filed Vitals:   11/15/14 1300  BP: 148/91  Pulse: 110  Temp: 97.7 F (36.5 C)  Resp: 20    Intake/Output Summary (Last 24 hours) at 11/15/14 1843 Last data filed at 11/15/14 1753  Gross per 24 hour  Intake 1926.67 ml  Output      0 ml  Net 1926.67 ml   Filed Weights   11/13/14 1703 11/13/14 2346  Weight: 48.535 kg (107 lb) 48.535 kg (107 lb)    Exam:   General:  NAD  Cardiovascular: S1, s2 rRR  Respiratory: cta b  Abdomen: soft, nt, nd, bs+  Musculoskeletal: no edema b/l   Data Reviewed: Basic Metabolic Panel:  Recent Labs Lab 11/13/14 1745 11/14/14 0600 11/15/14 0639  NA 138 140 138  K 3.6 3.7 3.5  CL 101 107 107  CO2 GLUCOSE 140* 92 92  BUN 19 16 26*  CREATININE 0.68 0.60 0.84  CALCIUM 9.3 8.6* 8.2*   Liver Function Tests:  Recent  Labs Lab 11/13/14 1745 11/14/14 0600 11/15/14 0639  AST 466* 204* 91*  ALT 244* 180* 110*  ALKPHOS 174* 133* 94  BILITOT 1.9* 3.6* 2.0*  PROT 7.4 6.5 5.9*  ALBUMIN 3.4* 2.9* 2.5*    Recent Labs Lab 11/13/14 1745  LIPASE 23   No results for input(s): AMMONIA in the last 168 hours. CBC:  Recent Labs Lab 11/13/14 1745 11/14/14 0600 11/15/14 0639  WBC 8.5 14.0* 16.3*  NEUTROABS 8.2* 12.9*  --   HGB 11.9* 10.7* 11.1*  HCT 36.2 33.0* 33.8*  MCV 97.1 97.1 95.5  PLT 112* 100* 82*   Cardiac Enzymes: No results for input(s): CKTOTAL, CKMB, CKMBINDEX, TROPONINI in the last 168 hours. BNP (last 3 results) No results for input(s): BNP in the last 8760 hours.  ProBNP (last 3 results) No results for input(s): PROBNP in the last 8760 hours.  CBG: No results for input(s): GLUCAP in the last 168 hours.  Recent Results (from the past 240 hour(s))  Blood culture (routine x 2)     Status: None (Preliminary result)   Collection Time: 11/13/14  5:40 PM  Result Value Ref Range Status   Specimen Description BLOOD LEFT ARM  Final   Special Requests   Final    BOTTLES DRAWN AEROBIC AND ANAEROBIC AEB=10CC ANA=8CC   Culture   Final    ESCHERICHIA COLI Note: Gram Stain Report Called to,Read Back By and Verified With: CINDY PHILLIPS RN @ 3:33 PM 11/14/14 BY DWEEKS Performed at [facility]    Report Status PENDING  Incomplete  Blood culture (routine x 2)     Status: None (Preliminary result)   Collection Time: 11/13/14  5:45 PM  Result Value Ref Range Status   Specimen Description BLOOD LEFT HAND  Final   Special Requests BOTTLES DRAWN AEROBIC AND ANAEROBIC 12CC  Final   Culture   Final    ESCHERICHIA COLI Note: Gram Stain Report Called to,Read Back By and Verified With: CINDY PHILLIPS RN @ 3:33 PM 11/14/14 BY DWEEKS Performed at [facility]    Report Status PENDING  Incomplete  Urine culture     Status: None   Collection Time: 11/13/14  7:19 PM  Result Value Ref  Range Status   Specimen Description URINE, CATHETERIZED  Final   Special Requests NONE  Final   Colony Count NO GROWTH Performed at [facility]   Final   Culture NO GROWTH Performed at [facility]   Final   Report Status 11/15/2014 FINAL  Final  MRSA PCR Screening     Status: None   Collection Time: 11/14/14 12:40 AM  Result Value Ref Range Status   MRSA by PCR NEGATIVE NEGATIVE Final    Comment:        The GeneXpert MRSA Assay (FDA approved for NASAL specimens only), is one component of a comprehensive MRSA colonization surveillance program. It is not intended to diagnose MRSA infection nor to guide or monitor treatment for MRSA infections.      Studies: Ct Abdomen Pelvis W Contrast  11/13/2014   CLINICAL DATA:  Acute onset of fever and vomiting. Initial encounter.  EXAM: CT ABDOMEN AND PELVIS WITH CONTRAST  TECHNIQUE: Multidetector CT imaging of the abdomen and pelvis was performed using the standard protocol following bolus administration of intravenous contrast.  CONTRAST:  OMNIPAQUE IOHEXOL 300 MG/ML  SOLN  COMPARISON:  CT of the abdomen and pelvis from 10/11/2014  FINDINGS: Mild focal right basilar airspace opacity raises concern for pneumonia.  There is suggestion of mild intrahepatic biliary ductal dilatation; this is only minimally more prominent than on the prior study. The common bile duct measures up to 1.0 cm in diameter, mildly dilated for age. Trace pericholecystic fluid is noted. This could reflect distal obstruction. The liver and spleen are otherwise unremarkable. The pancreas is grossly unremarkable in appearance. The adrenal glands are grossly unremarkable.  A small 7 mm cyst is noted at the upper pole of the right kidney. The kidneys are otherwise unremarkable. There is no evidence of hydronephrosis. No renal or ureteral stones are seen. No perinephric stranding is appreciated.  No free fluid is identified. The small bowel is unremarkable in  appearance. The stomach is within normal limits. No acute vascular abnormalities are seen. Scattered calcification is noted along the abdominal aorta and its branches. Mild surrounding retroperitoneal edema is noted, of uncertain significance, with associated nodes measuring up to 8 mm in short axis. There is an unusual collection of calcified nodes within the mesentery at the left mid abdomen, possibly reflecting remote granulomatous disease, unchanged from the recent prior study.  The appendix is not definitely characterized; there is no evidence for appendicitis. Contrast progresses to the level of the proximal transverse colon. The colon is largely filled with stool, and the rectum is distended to 9.4 cm in transverse dimension with stool, raising concern for constipation and mild fecal impaction.  The bladder is mildly distended and grossly unremarkable. The patient is status post hysterectomy.  No suspicious adnexal masses are seen. No inguinal lymphadenopathy is seen.  No acute osseous abnormalities are identified. Facet disease is noted along the lumbar spine.  IMPRESSION: 1. Mild focal right basilar airspace opacity raises concern for pneumonia. 2. Suggestion of mild intrahepatic biliary ductal dilatation, only minimally more prominent than on the prior study. Common bile duct measures up to 1.0 cm in diameter, mildly dilated for age. Trace pericholecystic fluid noted. This could reflect mild intermittent distal obstruction, though the patient's bilirubin has decreased since May. If the patient's symptoms persist, MRCP or ERCP could be considered for further evaluation. 3. Colon largely filled with stool. Rectum distended to 9.4 cm in transverse dimension with stool, raising concern for constipation and mild fecal impaction. 4. Mild retroperitoneal edema is somewhat more prominent than on the prior study and of uncertain significance, with associated nodes measuring up to 8 mm in short axis, relatively  stable in size. 5. Unusual collection of calcified nodes within the mesentery at the left mid abdomen, possibly reflecting remote granulomatous disease. Sequelae of prior tuberculosis infection could have a similar appearance; would correlate with the patient's history. 6. Scattered calcification along the abdominal aorta and its branches. 7. Small right renal cyst noted.   Electronically Signed   By: Roanna Raider M.D.   On: 11/13/2014 21:50   Mr 3d Recon At Scanner  11/14/2014   CLINICAL DATA:  Sepsis with hepatic failure. Patient is confused and could not follow peripheral commands.  EXAM: MRI ABDOMEN WITHOUT AND WITH CONTRAST (INCLUDING MRCP)  TECHNIQUE: Multiplanar multisequence MR imaging of the abdomen was performed both before and after the administration of intravenous contrast. Heavily T2-weighted images of the biliary and pancreatic ducts were obtained, and three-dimensional MRCP images were rendered by post processing.  CONTRAST:  2mL MULTIHANCE GADOBENATE DIMEGLUMINE 529 MG/ML IV SOLN  COMPARISON:  CT 11/13/2014  FINDINGS: Lower chest:  Small bilateral pleural effusions.  Hepatobiliary: No intrahepatic biliary duct dilatation. The MRCP sequences are severely degraded by patient respiratory motion. Patient was confused and could not follow voice commands which is required for MRI imaging. No gross biliary abnormality.  Pancreas: Normal pancreatic parenchymal intensity. No ductal dilatation or inflammation.  Spleen: Normal spleen.  Adrenals/urinary tract: Adrenal glands and kidneys are normal.  Stomach/Bowel: Stomach and limited of the small bowel is unremarkable  Vascular/Lymphatic: Abdominal aortic normal caliber. No retroperitoneal periportal lymphadenopathy.  Musculoskeletal: No aggressive osseous lesion  IMPRESSION: 1. No hepatic abnormality.  No biliary duct dilatation. 2. MRCP sequences are limited due to patient inability to follow commands. No gross biliary abnormality. 3. Small bilateral  pleural effusions.   Electronically Signed   By: Genevive Bi M.D.   On: 11/14/2014 15:51   Mr Abd W/wo Cm/mrcp  11/14/2014   CLINICAL DATA:  Sepsis with hepatic failure. Patient is confused and could not follow peripheral commands.  EXAM: MRI ABDOMEN WITHOUT AND WITH CONTRAST (INCLUDING MRCP)  TECHNIQUE: Multiplanar multisequence MR imaging of the abdomen was performed both before and after the administration of intravenous contrast. Heavily T2-weighted images of the biliary and pancreatic ducts were obtained, and three-dimensional MRCP images were rendered by post processing.  CONTRAST:  44mL MULTIHANCE GADOBENATE DIMEGLUMINE 529 MG/ML IV SOLN  COMPARISON:  CT 11/13/2014  FINDINGS: Lower chest:  Small bilateral pleural effusions.  Hepatobiliary: No intrahepatic biliary duct dilatation. The MRCP sequences are severely degraded by patient respiratory motion. Patient was confused and could not follow voice commands which is required for MRI imaging. No gross  biliary abnormality.  Pancreas: Normal pancreatic parenchymal intensity. No ductal dilatation or inflammation.  Spleen: Normal spleen.  Adrenals/urinary tract: Adrenal glands and kidneys are normal.  Stomach/Bowel: Stomach and limited of the small bowel is unremarkable  Vascular/Lymphatic: Abdominal aortic normal caliber. No retroperitoneal periportal lymphadenopathy.  Musculoskeletal: No aggressive osseous lesion  IMPRESSION: 1. No hepatic abnormality.  No biliary duct dilatation. 2. MRCP sequences are limited due to patient inability to follow commands. No gross biliary abnormality. 3. Small bilateral pleural effusions.   Electronically Signed   By: Genevive Bi M.D.   On: 11/14/2014 15:51    Scheduled Meds: . enoxaparin (LOVENOX) injection  40 mg Subcutaneous Q24H  . feeding supplement (ENSURE)  1 Container Oral TID BM  . pantoprazole  40 mg Oral BID AC  . piperacillin-tazobactam (ZOSYN)  IV  3.375 g Intravenous Q8H  . traZODone  200 mg Oral  QHS  . ziprasidone  40 mg Oral Daily   Continuous Infusions: . sodium chloride 100 mL/hr at 11/15/14 1229    Principal Problem:   Sepsis due to Escherichia coli Active Problems:   Transaminitis   Nausea & vomiting   Alzheimer's dementia   Nonverbal   QT prolongation   Common bile duct dilation   Thrombocytopenia   Fecal impaction   CN (constipation)    Time spent:    Esperansa Sarabia  [facility] Pager [phone]. If 7PM-7AM, please contact night-coverage at [email or web address], password [facility] 11/15/2014, 6:43 PM  LOS: 2 days

## 2014-11-16 ENCOUNTER — Other Ambulatory Visit: Payer: Self-pay

## 2014-11-16 ENCOUNTER — Telehealth: Payer: Self-pay | Admitting: Gastroenterology

## 2014-11-16 DIAGNOSIS — K315 Obstruction of duodenum: Secondary | ICD-10-CM

## 2014-11-16 DIAGNOSIS — K8309 Other cholangitis: Secondary | ICD-10-CM

## 2014-11-16 DIAGNOSIS — K8050 Calculus of bile duct without cholangitis or cholecystitis without obstruction: Secondary | ICD-10-CM

## 2014-11-16 LAB — CULTURE, BLOOD (ROUTINE X 2)

## 2014-11-16 LAB — BASIC METABOLIC PANEL
Anion gap: 7 (ref 5–15)
BUN: 10 mg/dL (ref 6–20)
CALCIUM: 8.6 mg/dL — AB (ref 8.9–10.3)
CO2: 27 mmol/L (ref 22–32)
CREATININE: 0.5 mg/dL (ref 0.44–1.00)
Chloride: 109 mmol/L (ref 101–111)
GFR calc Af Amer: >60 mL/min (ref >60)
GFR calc non Af Amer: >60 mL/min (ref >60)
Glucose, Bld: 95 mg/dL (ref 65–99)
POTASSIUM: 3.2 mmol/L — AB (ref 3.5–5.1)
Sodium: 143 mmol/L (ref 135–145)

## 2014-11-16 LAB — CBC
HEMATOCRIT: 34 % — AB (ref 36.0–46.0)
Hemoglobin: 11.2 g/dL — ABNORMAL LOW (ref 12.0–15.0)
MCH: 31.5 pg (ref 26.0–34.0)
MCHC: 32.9 g/dL (ref 30.0–36.0)
MCV: 95.5 fL (ref 78.0–100.0)
Platelets: 92 10*3/uL — ABNORMAL LOW (ref 150–400)
RBC: 3.56 MIL/uL — ABNORMAL LOW (ref 3.87–5.11)
RDW: 14.4 % (ref 11.5–15.5)
WBC: 14.1 10*3/uL — AB (ref 4.0–10.5)

## 2014-11-16 LAB — IGG, IGA, IGM
IgA: 440 mg/dL — ABNORMAL HIGH (ref 64–422)
IgG (Immunoglobin G), Serum: 1411 mg/dL (ref 700–1600)
IgM, Serum: 36 mg/dL (ref 26–217)

## 2014-11-16 LAB — ANTINUCLEAR ANTIBODIES, IFA

## 2014-11-16 LAB — FANA STAINING PATTERNS: Speckled Pattern: 1:160 {titer} — ABNORMAL HIGH

## 2014-11-16 LAB — MITOCHONDRIAL ANTIBODIES: Mitochondrial M2 Ab, IgG: 6 Units (ref 0.0–20.0)

## 2014-11-16 LAB — PROCALCITONIN: Procalcitonin: 51.57 ng/mL

## 2014-11-16 MED ORDER — LINACLOTIDE 145 MCG PO CAPS
145.0000 ug | ORAL_CAPSULE | Freq: Every day | ORAL | Status: DC
  Administered 2014-11-16 – 2014-11-17 (×2): 145 ug via ORAL
  Filled 2014-11-16 (×2): qty 1

## 2014-11-16 MED ORDER — SULFAMETHOXAZOLE-TRIMETHOPRIM 800-160 MG PO TABS
1.0000 | ORAL_TABLET | Freq: Two times a day (BID) | ORAL | Status: DC
  Administered 2014-11-16 – 2014-11-17 (×2): 1 via ORAL
  Filled 2014-11-16 (×2): qty 1

## 2014-11-16 MED ORDER — POTASSIUM CHLORIDE CRYS ER 20 MEQ PO TBCR
40.0000 meq | EXTENDED_RELEASE_TABLET | Freq: Once | ORAL | Status: AC
  Administered 2014-11-16: 40 meq via ORAL
  Filled 2014-11-16: qty 2

## 2014-11-16 NOTE — Progress Notes (Signed)
[facility] PROGRESS NOTE  Anani Gu ZOX:[id number] DOB: 02-07-1929 DOA: 11/13/2014 PCP: [facility]  Assessment/Plan: 1. Sepsis due to Escherichia coli. Patient has positive blood cultures for Escherichia coli. Source is thought to be biliary system. Currently, she does not have any evidence of cholecystitis. GI is following. Continue current antibiotics for another 24 hours. Will transition to oral ciprofloxacin tomorrow. She will need a total of 2 weeks of antibiotics. Plans are for further outpatient GI workup such as EUS, possible ERCP. urine culture has shown no growth. 2. Elevated LFTs. Likely related to underlying sepsis. Continue to follow. 3. Thrombocytopenia. Likely related to sepsis. Continue to follow. Hold Lovenox and use SCDs. Improving 4. Fecal impaction. Being followed by GI. Started on enemas and bowel regimen. She did have several bowel movements overnight 5. Advanced Alzheimer's dementia.  Code Status: DNR Family Communication: discussed with sister at the bedside Disposition Plan: discharge back to family care home once improved, possibly tomorrow   Consultants:  gastroenterology  Procedures:    Antibiotics:  zosyn  HPI/Subjective: Confused, cannot provide history  Objective: Filed Vitals:   11/16/14 0637  BP: 155/97  Pulse: 106  Temp: 98 F (36.7 C)  Resp: 17    Intake/Output Summary (Last 24 hours) at 11/16/14 1209 Last data filed at 11/16/14 0800  Gross per 24 hour  Intake    540 ml  Output      0 ml  Net    540 ml   Filed Weights   11/13/14 1703 11/13/14 2346  Weight: 48.535 kg (107 lb) 48.535 kg (107 lb)    Exam:   General:  NAD  Cardiovascular: S1, s2 rRR  Respiratory: poor effort, clear bilaterally  Abdomen: soft, nt, nd, bs+  Musculoskeletal: no edema b/l   Data Reviewed: Basic Metabolic Panel:  Recent Labs Lab 11/13/14 1745 11/14/14 0600 11/15/14 0639 11/16/14 0550  NA 138 140  138 143  K 3.6 3.7 3.5 3.2*  CL 101 107 107 109  CO2 GLUCOSE 140* 92 92 95  BUN 19 16 26* 10  CREATININE 0.68 0.60 0.84 0.50  CALCIUM 9.3 8.6* 8.2* 8.6*   Liver Function Tests:  Recent Labs Lab 11/13/14 1745 11/14/14 0600 11/15/14 0639  AST 466* 204* 91*  ALT 244* 180* 110*  ALKPHOS 174* 133* 94  BILITOT 1.9* 3.6* 2.0*  PROT 7.4 6.5 5.9*  ALBUMIN 3.4* 2.9* 2.5*    Recent Labs Lab 11/13/14 1745  LIPASE 23   No results for input(s): AMMONIA in the last 168 hours. CBC:  Recent Labs Lab 11/13/14 1745 11/14/14 0600 11/15/14 0639 11/16/14 0550  WBC 8.5 14.0* 16.3* 14.1*  NEUTROABS 8.2* 12.9*  --   --   HGB 11.9* 10.7* 11.1* 11.2*  HCT 36.2 33.0* 33.8* 34.0*  MCV 97.1 97.1 95.5 95.5  PLT 112* 100* 82* 92*   Cardiac Enzymes: No results for input(s): CKTOTAL, CKMB, CKMBINDEX, TROPONINI in the last 168 hours. BNP (last 3 results) No results for input(s): BNP in the last 8760 hours.  ProBNP (last 3 results) No results for input(s): PROBNP in the last 8760 hours.  CBG: No results for input(s): GLUCAP in the last 168 hours.  Recent Results (from the past 240 hour(s))  Blood culture (routine x 2)     Status: None   Collection Time: 11/13/14  5:40 PM  Result Value Ref Range Status   Specimen Description BLOOD LEFT ARM  Final   Special  Requests   Final    BOTTLES DRAWN AEROBIC AND ANAEROBIC AEB=10CC ANA=8CC   Culture   Final    ESCHERICHIA COLI Note: SUSCEPTIBILITIES PERFORMED ON PREVIOUS CULTURE WITHIN THE LAST 5 DAYS. Note: Gram Stain Report Called to,Read Back By and Verified With: CINDY PHILLIPS RN @ 3:33 PM 12-09-14 BY DWEEKS Performed at [facility]    Report Status 11/16/2014 FINAL  Final  Blood culture (routine x 2)     Status: None   Collection Time: 11/13/14  5:45 PM  Result Value Ref Range Status   Specimen Description BLOOD LEFT HAND  Final   Special Requests BOTTLES DRAWN AEROBIC AND ANAEROBIC 12CC  Final   Culture   Final     ESCHERICHIA COLI Note: Gram Stain Report Called to,Read Back By and Verified With: CINDY PHILLIPS RN @ 3:33 PM 12-09-14 BY DWEEKS Performed at [facility]    Report Status 11/16/2014 FINAL  Final   Organism ID, Bacteria ESCHERICHIA COLI  Final      Susceptibility   Escherichia coli - MIC*    AMPICILLIN 4 SENSITIVE Sensitive     AMPICILLIN/SULBACTAM 4 SENSITIVE Sensitive     CEFAZOLIN <=4 SENSITIVE Sensitive     CEFEPIME <=1 SENSITIVE Sensitive     CEFTAZIDIME <=1 SENSITIVE Sensitive     CEFTRIAXONE <=1 SENSITIVE Sensitive     CIPROFLOXACIN <=0.25 SENSITIVE Sensitive     GENTAMICIN <=1 SENSITIVE Sensitive     IMIPENEM <=0.25 SENSITIVE Sensitive     PIP/TAZO <=4 SENSITIVE Sensitive     TOBRAMYCIN <=1 SENSITIVE Sensitive     TRIMETH/SULFA <=20 SENSITIVE Sensitive     * ESCHERICHIA COLI  Urine culture     Status: None   Collection Time: 11/13/14  7:19 PM  Result Value Ref Range Status   Specimen Description URINE, CATHETERIZED  Final   Special Requests NONE  Final   Colony Count NO GROWTH Performed at [facility]   Final   Culture NO GROWTH Performed at [facility]   Final   Report Status 11/15/2014 FINAL  Final  MRSA PCR Screening     Status: None   Collection Time: Dec 09, 2014 12:40 AM  Result Value Ref Range Status   MRSA by PCR NEGATIVE NEGATIVE Final    Comment:        The GeneXpert MRSA Assay (FDA approved for NASAL specimens only), is one component of a comprehensive MRSA colonization surveillance program. It is not intended to diagnose MRSA infection nor to guide or monitor treatment for MRSA infections.      Studies: Mr 3d Recon At Scanner  Dec 09, 2014   CLINICAL DATA:  Sepsis with hepatic failure. Patient is confused and could not follow peripheral commands.  EXAM: MRI ABDOMEN WITHOUT AND WITH CONTRAST (INCLUDING MRCP)  TECHNIQUE: Multiplanar multisequence MR imaging of the abdomen was performed both before and after the  administration of intravenous contrast. Heavily T2-weighted images of the biliary and pancreatic ducts were obtained, and three-dimensional MRCP images were rendered by post processing.  CONTRAST:  10mL MULTIHANCE GADOBENATE DIMEGLUMINE 529 MG/ML IV SOLN  COMPARISON:  CT 11/13/2014  FINDINGS: Lower chest:  Small bilateral pleural effusions.  Hepatobiliary: No intrahepatic biliary duct dilatation. The MRCP sequences are severely degraded by patient respiratory motion. Patient was confused and could not follow voice commands which is required for MRI imaging. No gross biliary abnormality.  Pancreas: Normal pancreatic parenchymal intensity. No ductal dilatation or inflammation.  Spleen: Normal spleen.  Adrenals/urinary tract: Adrenal  glands and kidneys are normal.  Stomach/Bowel: Stomach and limited of the small bowel is unremarkable  Vascular/Lymphatic: Abdominal aortic normal caliber. No retroperitoneal periportal lymphadenopathy.  Musculoskeletal: No aggressive osseous lesion  IMPRESSION: 1. No hepatic abnormality.  No biliary duct dilatation. 2. MRCP sequences are limited due to patient inability to follow commands. No gross biliary abnormality. 3. Small bilateral pleural effusions.   Electronically Signed   By: Genevive Bi M.D.   On: 11/14/2014 15:51   Mr Abd W/wo Cm/mrcp  11/14/2014   CLINICAL DATA:  Sepsis with hepatic failure. Patient is confused and could not follow peripheral commands.  EXAM: MRI ABDOMEN WITHOUT AND WITH CONTRAST (INCLUDING MRCP)  TECHNIQUE: Multiplanar multisequence MR imaging of the abdomen was performed both before and after the administration of intravenous contrast. Heavily T2-weighted images of the biliary and pancreatic ducts were obtained, and three-dimensional MRCP images were rendered by post processing.  CONTRAST:  44mL MULTIHANCE GADOBENATE DIMEGLUMINE 529 MG/ML IV SOLN  COMPARISON:  CT 11/13/2014  FINDINGS: Lower chest:  Small bilateral pleural effusions.  Hepatobiliary:  No intrahepatic biliary duct dilatation. The MRCP sequences are severely degraded by patient respiratory motion. Patient was confused and could not follow voice commands which is required for MRI imaging. No gross biliary abnormality.  Pancreas: Normal pancreatic parenchymal intensity. No ductal dilatation or inflammation.  Spleen: Normal spleen.  Adrenals/urinary tract: Adrenal glands and kidneys are normal.  Stomach/Bowel: Stomach and limited of the small bowel is unremarkable  Vascular/Lymphatic: Abdominal aortic normal caliber. No retroperitoneal periportal lymphadenopathy.  Musculoskeletal: No aggressive osseous lesion  IMPRESSION: 1. No hepatic abnormality.  No biliary duct dilatation. 2. MRCP sequences are limited due to patient inability to follow commands. No gross biliary abnormality. 3. Small bilateral pleural effusions.   Electronically Signed   By: Genevive Bi M.D.   On: 11/14/2014 15:51    Scheduled Meds: . feeding supplement (ENSURE)  1 Container Oral TID BM  . pantoprazole  40 mg Oral BID AC  . piperacillin-tazobactam (ZOSYN)  IV  3.375 g Intravenous Q8H  . traZODone  200 mg Oral QHS  . ziprasidone  40 mg Oral Daily   Continuous Infusions: . sodium chloride 125 mL/hr at 11/16/14 1206    Principal Problem:   Sepsis due to Escherichia coli Active Problems:   Transaminitis   Nausea & vomiting   Alzheimer's dementia   Nonverbal   QT prolongation   Common bile duct dilation   Thrombocytopenia   Fecal impaction   CN (constipation)    Time spent:    MEMON,JEHANZEB  [facility] Pager [phone]. If 7PM-7AM, please contact night-coverage at [email or web address], password [facility] 11/16/2014, 12:09 PM  LOS: 3 days

## 2014-11-16 NOTE — Progress Notes (Signed)
Patient has had several bowel movements during the night.  Patient is still impacted.  Stool is mostly solid and patient does seem to experience discomfort when attempting to have bowel movements.

## 2014-11-16 NOTE — Telephone Encounter (Signed)
REFER PT TO DR. WILL OUTLAW, Dx: CHOLANGITIS MAY/JUL 2016 ASSOCIATED WITH E. COLI BACTEREMIA. PT NEED EUS TO EVALUATE FOR SLUDGE OR DISTAL CBD STONE AND POSSIBLE ERCP FOR BENIGN PAPILLARY STENOSIS.

## 2014-11-16 NOTE — Progress Notes (Signed)
ANTIBIOTIC CONSULT NOTE - FOLLOW UP  Pharmacy Consult for Zosyn  Indication: rule out sepsis, bacteremia  No Known Allergies  Patient Measurements: Height:  (172.7 cm) Weight: 107 lb (48.535 kg) IBW/kg (Calculated) : 63.9  Vital Signs: Temp: 98 F (36.7 C) (06/16 0637) Temp Source: Oral (06/16 0637) BP: 155/97 mmHg (06/16 0637) Pulse Rate: 106 (06/16 0637) Intake/Output from previous day: 06/15 0701 - 06/16 0700 In: 560 [P.O.:560] Out: -  Intake/Output from this shift: Total I/O In: 100 [P.O.:100] Out: -   Labs:  Recent Labs  11/14/14 0600 11/15/14 0639 11/16/14 0550  WBC 14.0* 16.3* 14.1*  HGB 10.7* 11.1* 11.2*  PLT 100* 82* 92*  CREATININE 0.60 0.84 0.50   Estimated Creatinine Clearance: 40.1 mL/min (by C-G formula based on Cr of 0.5). No results for input(s): VANCOTROUGH, VANCOPEAK, VANCORANDOM, GENTTROUGH, GENTPEAK, GENTRANDOM, TOBRATROUGH, TOBRAPEAK, TOBRARND, AMIKACINPEAK, AMIKACINTROU, AMIKACIN in the last 72 hours.   Microbiology: Recent Results (from the past 720 hour(s))  Blood culture (routine x 2)     Status: None   Collection Time: 11/13/14  5:40 PM  Result Value Ref Range Status   Specimen Description BLOOD LEFT ARM  Final   Special Requests   Final    BOTTLES DRAWN AEROBIC AND ANAEROBIC AEB=10CC ANA=8CC   Culture   Final    ESCHERICHIA COLI Note: SUSCEPTIBILITIES PERFORMED ON PREVIOUS CULTURE WITHIN THE LAST 5 DAYS. Note: Gram Stain Report Called to,Read Back By and Verified With: CINDY PHILLIPS RN @ 3:33 PM 11/14/14 BY DWEEKS Performed at [facility]    Report Status 11/16/2014 FINAL  Final  Blood culture (routine x 2)     Status: None   Collection Time: 11/13/14  5:45 PM  Result Value Ref Range Status   Specimen Description BLOOD LEFT HAND  Final   Special Requests BOTTLES DRAWN AEROBIC AND ANAEROBIC 12CC  Final   Culture   Final    ESCHERICHIA COLI Note: Gram Stain Report Called to,Read Back By and Verified With: CINDY  PHILLIPS RN @ 3:33 PM 11/14/14 BY DWEEKS Performed at [facility]    Report Status 11/16/2014 FINAL  Final   Organism ID, Bacteria ESCHERICHIA COLI  Final      Susceptibility   Escherichia coli - MIC*    AMPICILLIN 4 SENSITIVE Sensitive     AMPICILLIN/SULBACTAM 4 SENSITIVE Sensitive     CEFAZOLIN <=4 SENSITIVE Sensitive     CEFEPIME <=1 SENSITIVE Sensitive     CEFTAZIDIME <=1 SENSITIVE Sensitive     CEFTRIAXONE <=1 SENSITIVE Sensitive     CIPROFLOXACIN <=0.25 SENSITIVE Sensitive     GENTAMICIN <=1 SENSITIVE Sensitive     IMIPENEM <=0.25 SENSITIVE Sensitive     PIP/TAZO <=4 SENSITIVE Sensitive     TOBRAMYCIN <=1 SENSITIVE Sensitive     TRIMETH/SULFA <=20 SENSITIVE Sensitive     * ESCHERICHIA COLI  Urine culture     Status: None   Collection Time: 11/13/14  7:19 PM  Result Value Ref Range Status   Specimen Description URINE, CATHETERIZED  Final   Special Requests NONE  Final   Colony Count NO GROWTH Performed at [facility]   Final   Culture NO GROWTH Performed at [facility]   Final   Report Status 11/15/2014 FINAL  Final  MRSA PCR Screening     Status: None   Collection Time: 11/14/14 12:40 AM  Result Value Ref Range Status   MRSA by PCR NEGATIVE NEGATIVE Final    Comment:  The GeneXpert MRSA Assay (FDA approved for NASAL specimens only), is one component of a comprehensive MRSA colonization surveillance program. It is not intended to diagnose MRSA infection nor to guide or monitor treatment for MRSA infections.     Anti-infectives    Start     Dose/Rate Route Frequency Ordered Stop   11/14/14 1800  vancomycin (VANCOCIN) IVPB 750 mg/150 ml premix  Status:  Discontinued     750 mg 150 mL/hr over 60 Minutes Intravenous Every 24 hours 11/13/14 2323 11/15/14 1841   11/14/14 0400  piperacillin-tazobactam (ZOSYN) IVPB 3.375 g     3.375 g 12.5 mL/hr over 240 Minutes Intravenous Every 8 hours 11/13/14 2323     11/13/14 1815   piperacillin-tazobactam (ZOSYN) IVPB 3.375 g     3.375 g 100 mL/hr over 30 Minutes Intravenous  Once 11/13/14 1800 11/13/14 2002   11/13/14 1815  vancomycin (VANCOCIN) IVPB 1000 mg/200 mL premix     1,000 mg 200 mL/hr over 60 Minutes Intravenous  Once 11/13/14 1800 11/13/14 2039     Assessment: Okay for Protocol.  E Coli SENS to Zosyn.  Vancomycin d/c'd per MD.    Goal of Therapy:  Eradicate infection.   Plan:  Zosyn 3.375gm IV every 8 hours. Follow-up micro data, labs, vitals.  Duration of therapy per MD  Valrie Hart A 11/16/2014,11:40 AM

## 2014-11-16 NOTE — Progress Notes (Signed)
Subjective:  Resting comfortably. No complaints. Alert but confused.   Objective: Vital signs in last 24 hours: Temp:  [97.7 F (36.5 C)-99.4 F (37.4 C)] 98 F (36.7 C) (06/16 0637) Pulse Rate:  [106-115] 106 (06/16 0637) Resp:  [16-20] 17 (06/16 0637) BP: (125-155)/(91-103) 155/97 mmHg (06/16 0637) SpO2:  [97 %-99 %] 99 % (06/16 0637) Last BM Date: 11/15/14 General:   Alert,  Well-developed, well-nourished, pleasant and cooperative in NAD Head:  Normocephalic and atraumatic. Eyes:  Sclera clear, no icterus.   Abdomen:  Soft, nontender and nondistended.   Normal bowel sounds, without guarding, and without rebound.   Rectal: very soft stool (nonbloody) in rectum, no impaction. Passed large BM after DRE.  Extremities:  Without clubbing, deformity or edema. Neurologic:  Alert and  oriented x4;  grossly normal neurologically. Skin:  Intact without significant lesions or rashes. Psych:  Alert and cooperative. Normal mood and affect.  Intake/Output from previous day: 06/15 0701 - 06/16 0700 In: 560 [P.O.:560] Out: -  Intake/Output this shift:    Lab Results: CBC  Recent Labs  11/14/14 0600 11/15/14 0639 11/16/14 0550  WBC 14.0* 16.3* 14.1*  HGB 10.7* 11.1* 11.2*  HCT 33.0* 33.8* 34.0*  MCV 97.1 95.5 95.5  PLT 100* 82* 92*   BMET  Recent Labs  11/14/14 0600 11/15/14 0639 11/16/14 0550  NA 140 138 143  K 3.7 3.5 3.2*  CL 107 107 109  CO2 25 25 27   GLUCOSE 92 92 95  BUN 16 26* 10  CREATININE 0.60 0.84 0.50  CALCIUM 8.6* 8.2* 8.6*   LFTs  Recent Labs  11/13/14 1745 11/14/14 0600 11/15/14 0639  BILITOT 1.9* 3.6* 2.0*  BILIDIR  --  2.4* 1.1*  IBILI  --  1.2* 0.9  ALKPHOS 174* 133* 94  AST 466* 204* 91*  ALT 244* 180* 110*  PROT 7.4 6.5 5.9*  ALBUMIN 3.4* 2.9* 2.5*    Recent Labs  11/13/14 1745  LIPASE 23   PT/INR  Recent Labs  11/14/14 0108  LABPROT 15.5*  INR 1.21      Imaging Studies: Ct Abdomen Pelvis W Contrast  11/13/2014    CLINICAL DATA:  Acute onset of fever and vomiting. Initial encounter.  EXAM: CT ABDOMEN AND PELVIS WITH CONTRAST  TECHNIQUE: Multidetector CT imaging of the abdomen and pelvis was performed using the standard protocol following bolus administration of intravenous contrast.  CONTRAST:  OMNIPAQUE IOHEXOL 300 MG/ML  SOLN  COMPARISON:  CT of the abdomen and pelvis from 10/11/2014  FINDINGS: Mild focal right basilar airspace opacity raises concern for pneumonia.  There is suggestion of mild intrahepatic biliary ductal dilatation; this is only minimally more prominent than on the prior study. The common bile duct measures up to 1.0 cm in diameter, mildly dilated for age. Trace pericholecystic fluid is noted. This could reflect distal obstruction. The liver and spleen are otherwise unremarkable. The pancreas is grossly unremarkable in appearance. The adrenal glands are grossly unremarkable.  A small 7 mm cyst is noted at the upper pole of the right kidney. The kidneys are otherwise unremarkable. There is no evidence of hydronephrosis. No renal or ureteral stones are seen. No perinephric stranding is appreciated.  No free fluid is identified. The small bowel is unremarkable in appearance. The stomach is within normal limits. No acute vascular abnormalities are seen. Scattered calcification is noted along the abdominal aorta and its branches. Mild surrounding retroperitoneal edema is noted, of uncertain significance, with associated nodes measuring up  to 8 mm in short axis. There is an unusual collection of calcified nodes within the mesentery at the left mid abdomen, possibly reflecting remote granulomatous disease, unchanged from the recent prior study.  The appendix is not definitely characterized; there is no evidence for appendicitis. Contrast progresses to the level of the proximal transverse colon. The colon is largely filled with stool, and the rectum is distended to 9.4 cm in transverse dimension with stool,  raising concern for constipation and mild fecal impaction.  The bladder is mildly distended and grossly unremarkable. The patient is status post hysterectomy. No suspicious adnexal masses are seen. No inguinal lymphadenopathy is seen.  No acute osseous abnormalities are identified. Facet disease is noted along the lumbar spine.  IMPRESSION: 1. Mild focal right basilar airspace opacity raises concern for pneumonia. 2. Suggestion of mild intrahepatic biliary ductal dilatation, only minimally more prominent than on the prior study. Common bile duct measures up to 1.0 cm in diameter, mildly dilated for age. Trace pericholecystic fluid noted. This could reflect mild intermittent distal obstruction, though the patient's bilirubin has decreased since May. If the patient's symptoms persist, MRCP or ERCP could be considered for further evaluation. 3. Colon largely filled with stool. Rectum distended to 9.4 cm in transverse dimension with stool, raising concern for constipation and mild fecal impaction. 4. Mild retroperitoneal edema is somewhat more prominent than on the prior study and of uncertain significance, with associated nodes measuring up to 8 mm in short axis, relatively stable in size. 5. Unusual collection of calcified nodes within the mesentery at the left mid abdomen, possibly reflecting remote granulomatous disease. Sequelae of prior tuberculosis infection could have a similar appearance; would correlate with the patient's history. 6. Scattered calcification along the abdominal aorta and its branches. 7. Small right renal cyst noted.   Electronically Signed   By: Roanna Raider M.D.   On: 11/13/2014 21:50   Mr 3d Recon At Scanner  11/14/2014   CLINICAL DATA:  Sepsis with hepatic failure. Patient is confused and could not follow peripheral commands.  EXAM: MRI ABDOMEN WITHOUT AND WITH CONTRAST (INCLUDING MRCP)  TECHNIQUE: Multiplanar multisequence MR imaging of the abdomen was performed both before and after  the administration of intravenous contrast. Heavily T2-weighted images of the biliary and pancreatic ducts were obtained, and three-dimensional MRCP images were rendered by post processing.  CONTRAST:  10mL MULTIHANCE GADOBENATE DIMEGLUMINE 529 MG/ML IV SOLN  COMPARISON:  CT 11/13/2014  FINDINGS: Lower chest:  Small bilateral pleural effusions.  Hepatobiliary: No intrahepatic biliary duct dilatation. The MRCP sequences are severely degraded by patient respiratory motion. Patient was confused and could not follow voice commands which is required for MRI imaging. No gross biliary abnormality.  Pancreas: Normal pancreatic parenchymal intensity. No ductal dilatation or inflammation.  Spleen: Normal spleen.  Adrenals/urinary tract: Adrenal glands and kidneys are normal.  Stomach/Bowel: Stomach and limited of the small bowel is unremarkable  Vascular/Lymphatic: Abdominal aortic normal caliber. No retroperitoneal periportal lymphadenopathy.  Musculoskeletal: No aggressive osseous lesion  IMPRESSION: 1. No hepatic abnormality.  No biliary duct dilatation. 2. MRCP sequences are limited due to patient inability to follow commands. No gross biliary abnormality. 3. Small bilateral pleural effusions.   Electronically Signed   By: Genevive Bi M.D.   On: 11/14/2014 15:51   Dg Chest Port 1 View  11/13/2014   CLINICAL DATA:  Code sepsis, fever  EXAM: PORTABLE CHEST - 1 VIEW  COMPARISON:  10/11/2014  FINDINGS: Increased interstitial markings. No focal consolidation.  No pleural effusion or pneumothorax.  The heart is normal in size.  IMPRESSION: No evidence of acute cardiopulmonary disease.   Electronically Signed   By: Charline Bills M.D.   On: 11/13/2014 19:04   Mr Abd W/wo Cm/mrcp  11/14/2014   CLINICAL DATA:  Sepsis with hepatic failure. Patient is confused and could not follow peripheral commands.  EXAM: MRI ABDOMEN WITHOUT AND WITH CONTRAST (INCLUDING MRCP)  TECHNIQUE: Multiplanar multisequence MR imaging of the  abdomen was performed both before and after the administration of intravenous contrast. Heavily T2-weighted images of the biliary and pancreatic ducts were obtained, and three-dimensional MRCP images were rendered by post processing.  CONTRAST:  10mL MULTIHANCE GADOBENATE DIMEGLUMINE 529 MG/ML IV SOLN  COMPARISON:  CT 11/13/2014  FINDINGS: Lower chest:  Small bilateral pleural effusions.  Hepatobiliary: No intrahepatic biliary duct dilatation. The MRCP sequences are severely degraded by patient respiratory motion. Patient was confused and could not follow voice commands which is required for MRI imaging. No gross biliary abnormality.  Pancreas: Normal pancreatic parenchymal intensity. No ductal dilatation or inflammation.  Spleen: Normal spleen.  Adrenals/urinary tract: Adrenal glands and kidneys are normal.  Stomach/Bowel: Stomach and limited of the small bowel is unremarkable  Vascular/Lymphatic: Abdominal aortic normal caliber. No retroperitoneal periportal lymphadenopathy.  Musculoskeletal: No aggressive osseous lesion  IMPRESSION: 1. No hepatic abnormality.  No biliary duct dilatation. 2. MRCP sequences are limited due to patient inability to follow commands. No gross biliary abnormality. 3. Small bilateral pleural effusions.   Electronically Signed   By: Genevive Bi M.D.   On: 11/14/2014 15:51  [2 weeks]   Assessment: 81 year old female admitted for sepsis (E.coli) with a recurrent transaminitis. Last increase in LFTs was a month ago with a single 7 mm gallstone demonstrated on abdominal US. Currently her CT shows mild intrahepatic biliary ductal dilation with CBD 1.0 cm and there is a question of partial obstruction. She did have some vomiting, but no abdominal pain.   MRCP limited as patient could not hold breath for exam. No gross biliary abnormality. Cannot exclude partial CBD obstruction or passage of stone. She was admitted for 16 days last month with E.coli bacteremia. No urine culture done  at that time. Her LFTs were elevated then as well.  Current CT shows persisting large volume of colonic stool. Rectum distended to 9.4cm in transverse dimension with stool. S/p partial disimpaction yesterday. Bowel regimen provided. Moderate amount of soft stool on repeat DRE today with subsequent large BM to follow. Recommend ongoing outpatient management for chronic constipation. Per records she takes lactulose 20g BID, Miralax prn.   Plan: 1. Add Linzess daily.  2. Repeat LFTs in AM. 3. F/U pending labs.  4. Complete 2 weeks of antibiotics. 5. Consider outpatient EUS.   Leanna Battles. [facility] [phone] 6/16/201612:47 PM     LOS: 3 days

## 2014-11-17 ENCOUNTER — Telehealth: Payer: Self-pay

## 2014-11-17 LAB — HEPATIC FUNCTION PANEL
ALT: 59 U/L — ABNORMAL HIGH (ref 14–54)
AST: 29 U/L (ref 15–41)
Albumin: 2.5 g/dL — ABNORMAL LOW (ref 3.5–5.0)
Alkaline Phosphatase: 96 U/L (ref 38–126)
BILIRUBIN DIRECT: 0.3 mg/dL (ref 0.1–0.5)
Indirect Bilirubin: 0.7 mg/dL (ref 0.3–0.9)
Total Bilirubin: 1 mg/dL (ref 0.3–1.2)
Total Protein: 6.6 g/dL (ref 6.5–8.1)

## 2014-11-17 LAB — BASIC METABOLIC PANEL
ANION GAP: 6 (ref 5–15)
BUN: 11 mg/dL (ref 6–20)
CHLORIDE: 106 mmol/L (ref 101–111)
CO2: 28 mmol/L (ref 22–32)
Calcium: 8.8 mg/dL — ABNORMAL LOW (ref 8.9–10.3)
Creatinine, Ser: 0.5 mg/dL (ref 0.44–1.00)
GFR calc non Af Amer: >60 mL/min (ref >60)
Glucose, Bld: 97 mg/dL (ref 65–99)
POTASSIUM: 3.8 mmol/L (ref 3.5–5.1)
Sodium: 140 mmol/L (ref 135–145)

## 2014-11-17 LAB — CBC
HCT: 33.7 % — ABNORMAL LOW (ref 36.0–46.0)
Hemoglobin: 11.3 g/dL — ABNORMAL LOW (ref 12.0–15.0)
MCH: 31.9 pg (ref 26.0–34.0)
MCHC: 33.5 g/dL (ref 30.0–36.0)
MCV: 95.2 fL (ref 78.0–100.0)
Platelets: 96 10*3/uL — ABNORMAL LOW (ref 150–400)
RBC: 3.54 MIL/uL — ABNORMAL LOW (ref 3.87–5.11)
RDW: 14.3 % (ref 11.5–15.5)
WBC: 11.8 10*3/uL — ABNORMAL HIGH (ref 4.0–10.5)

## 2014-11-17 LAB — ANTI-SMOOTH MUSCLE ANTIBODY, IGG: F-ACTIN AB IGG: 16 U (ref 0–19)

## 2014-11-17 MED ORDER — SIMETHICONE 40 MG/0.6ML PO SUSP
ORAL | Status: AC
  Filled 2014-11-17: qty 1.8

## 2014-11-17 MED ORDER — PANTOPRAZOLE SODIUM 40 MG PO TBEC: 40.0000 mg | DELAYED_RELEASE_TABLET | Freq: Every day | ORAL | Status: AC

## 2014-11-17 MED ORDER — LINACLOTIDE 145 MCG PO CAPS: 145.0000 ug | ORAL_CAPSULE | Freq: Every day | ORAL | Status: AC

## 2014-11-17 MED ORDER — SULFAMETHOXAZOLE-TRIMETHOPRIM 800-160 MG PO TABS: 1.0000 | ORAL_TABLET | Freq: Two times a day (BID) | ORAL | Status: DC

## 2014-11-17 MED ORDER — BUTAMBEN-TETRACAINE-BENZOCAINE 2-2-14 % EX AERO
INHALATION_SPRAY | CUTANEOUS | Status: DC
Start: 2014-11-17 — End: 2014-11-17
  Filled 2014-11-17: qty 112

## 2014-11-17 NOTE — Care Management Note (Signed)
Case Management Note  Patient Details  Name: [name] Reynolds MRN: [id number] Date of Birth: [birth year]  Subjective/Objective:                    Action/Plan:   Expected Discharge Date:  11/18/14               Expected Discharge Plan:  Assisted Living / Rest Home  In-House Referral:  Clinical Social Work  Discharge planning Services  CM Consult  Post Acute Care Choice:  NA Choice offered to:  NA  DME Arranged:    DME Agency:     HH Arranged:    HH Agency:     Status of Service:  Completed, signed off  Medicare Important Message Given:  Yes Date Medicare IM Given:  11/17/14 Medicare IM give by:  Arlyss Queen, RN BSN CM Date Additional Medicare IM Given:    Additional Medicare Important Message give by:     If discussed at Long Length of Stay Meetings, dates discussed:    Additional Comments: Pt discharged back to [facility]. CSW to arrange discharge to facility. No CM needs noted. Arlyss Queen [place], RN 11/17/2014, 10:54 AM

## 2014-11-17 NOTE — Telephone Encounter (Signed)
error 

## 2014-11-17 NOTE — Discharge Planning (Signed)
Pt discharged today to go back to [facility] pt was acompanied by her sister, pt left with all her belongings, vitals stable, no c/o pain, discharge papers sent with sister

## 2014-11-17 NOTE — Clinical Social Work Note (Signed)
CSW facilitated discharge.   CSW notified patient's sister, [name] Reynolds, who was at bedside, that patient was being discharged today.  [name] Reynolds advised that she and her husband would transport patient back to the facility.  CSW notified the facility that patient was being discharged and would be on a DYS 1 diet.  CSW also advised that patient would be transported back to the facility by her sister.  CSW signing off.  [name] Reynolds, [phone]

## 2014-11-17 NOTE — Telephone Encounter (Signed)
Referral made to [facility].  Pt has appointment on Monday.

## 2014-11-17 NOTE — Discharge Summary (Addendum)
Physician Discharge Summary  [name] Reynolds WUJ:[id number] DOB: [birth year] DOA: 11/13/2014  PCP: [facility]  Admit date: 11/13/2014 Discharge date: 11/17/2014  Time spent: 40 minutes  Recommendations for Outpatient Follow-up:  1. Patient has been referred for outpatient EUS/?ERCP with Dr. Dulce Sellar 2. Patient will return to family care home  Discharge Diagnoses:  Principal Problem:   Sepsis due to Escherichia coli Active Problems:   Transaminitis   Possible Cholangitis   Nausea & vomiting   Alzheimer's dementia   Nonverbal   Common bile duct dilation   Thrombocytopenia   Fecal impaction   CN (constipation)   Discharge Condition: improved  Diet recommendation: puree diet with thin liquids  Filed Weights   11/13/14 1703 11/13/14 2346  Weight: 48.535 kg (107 lb) 48.535 kg (107 lb)    History of present illness:  This is an 81 year old female with advanced dementia, nonverbal at baseline who presents to the emergency room from PCP office with a reported temperature of 106. There is no report of coughing or fevers prior to today's admission. No reported diarrhea. She had one episode of vomiting on the day of admission. Temperature in the emergency room was noted to be 103.  Hospital Course:  1. Sepsis due to Escherichia coli. Patient was found to have positive blood cultures for Escherichia coli. Source was thought to be biliary system. Currently, she does not have any evidence of cholecystitis, but there was concern that she may have had an element of cholangitis. GI was following the patient. She underwent imaging including MRCP did not show any hepatic abnormality or biliary duct dilatation. It was at that she may have passed a stone versus had bile duct sludge. She was treated with intravenous antibiotic and transition to oral Bactrim per recommendations from infectious disease. Plans are to have outpatient EUS and possible ERCP patient is now afebrile and  hemodynamically stable. 2. Elevated LFTs. Likely related to underlying sepsis.  3. Thrombocytopenia. Likely related to sepsis. Improving 4. Fecal impaction. Patient has been having significant constipation and fecal impaction. She was disimpacted here in the hospital. She is continued on bowel regimen. 5. Advanced Alzheimer's dementia. At baseline  Procedures:    Consultations:  Gastroenterology  Discharge Exam: Filed Vitals:   11/17/14 0523  BP: 145/73  Pulse: 98  Temp: 98.6 F (37 C)  Resp: 18    General: NAD Cardiovascular: S1, S2 RRR Respiratory: CTA B  Discharge Instructions   Discharge Instructions    Diet - low sodium heart healthy    Complete by:  As directed      Increase activity slowly    Complete by:  As directed           Current Discharge Medication List    START taking these medications   Details  Linaclotide (LINZESS) 145 MCG CAPS capsule Take 1 capsule (145 mcg total) by mouth daily. Qty: 30 capsule, Refills: 1    pantoprazole (PROTONIX) 40 MG tablet Take 1 tablet (40 mg total) by mouth daily. Qty: 30 tablet, Refills: 1    sulfamethoxazole-trimethoprim (BACTRIM DS,SEPTRA DS) 800-160 MG per tablet Take 1 tablet by mouth 2 (two) times daily. Until 6/28 Qty: 24 tablet, Refills: 0      CONTINUE these medications which have NOT CHANGED   Details  amLODipine (NORVASC) 5 MG tablet Take 1 tablet (5 mg total) by mouth daily. Qty: 30 tablet, Refills: 1    aspirin EC 81 MG tablet Take 81 mg by mouth  daily.    benztropine (COGENTIN) 0.5 MG tablet Take 0.5 mg by mouth 2 (two) times daily.    cholecalciferol (VITAMIN D) 1000 UNITS tablet Take 1,000 Units by mouth daily.    clonazePAM (KLONOPIN) 0.5 MG tablet Take 0.5 mg by mouth 2 (two) times daily.    famotidine (PEPCID) 20 MG tablet Take 20 mg by mouth at bedtime.    feeding supplement, ENSURE ENLIVE, (ENSURE ENLIVE) LIQD Take 237 mLs by mouth daily. Qty: 237 mL, Refills: 12    hydrOXYzine  (VISTARIL) 25 MG capsule Take 25 mg by mouth at bedtime.    lactulose (CHRONULAC) 10 GM/15ML solution Take 20 g by mouth 2 (two) times daily.    polyethylene glycol powder (GLYCOLAX/MIRALAX) powder Take 17 g by mouth daily as needed for mild constipation or moderate constipation (*Mix in 8 oz of water and drink as needed to have a bowel movement every 3 days.).    simvastatin (ZOCOR) 40 MG tablet Take 40 mg by mouth at bedtime.    traZODone (DESYREL) 100 MG tablet Take 200 mg by mouth at bedtime.    ziprasidone (GEODON) 40 MG capsule Take 40 mg by mouth daily.        No Known Allergies    The results of significant diagnostics from this hospitalization (including imaging, microbiology, ancillary and laboratory) are listed below for reference.    Significant Diagnostic Studies: Ct Abdomen Pelvis W Contrast  11/13/2014   CLINICAL DATA:  Acute onset of fever and vomiting. Initial encounter.  EXAM: CT ABDOMEN AND PELVIS WITH CONTRAST  TECHNIQUE: Multidetector CT imaging of the abdomen and pelvis was performed using the standard protocol following bolus administration of intravenous contrast.  CONTRAST:  OMNIPAQUE IOHEXOL 300 MG/ML  SOLN  COMPARISON:  CT of the abdomen and pelvis from 10/11/2014  FINDINGS: Mild focal right basilar airspace opacity raises concern for pneumonia.  There is suggestion of mild intrahepatic biliary ductal dilatation; this is only minimally more prominent than on the prior study. The common bile duct measures up to 1.0 cm in diameter, mildly dilated for age. Trace pericholecystic fluid is noted. This could reflect distal obstruction. The liver and spleen are otherwise unremarkable. The pancreas is grossly unremarkable in appearance. The adrenal glands are grossly unremarkable.  A small 7 mm cyst is noted at the upper pole of the right kidney. The kidneys are otherwise unremarkable. There is no evidence of hydronephrosis. No renal or ureteral stones are seen. No  perinephric stranding is appreciated.  No free fluid is identified. The small bowel is unremarkable in appearance. The stomach is within normal limits. No acute vascular abnormalities are seen. Scattered calcification is noted along the abdominal aorta and its branches. Mild surrounding retroperitoneal edema is noted, of uncertain significance, with associated nodes measuring up to 8 mm in short axis. There is an unusual collection of calcified nodes within the mesentery at the left mid abdomen, possibly reflecting remote granulomatous disease, unchanged from the recent prior study.  The appendix is not definitely characterized; there is no evidence for appendicitis. Contrast progresses to the level of the proximal transverse colon. The colon is largely filled with stool, and the rectum is distended to 9.4 cm in transverse dimension with stool, raising concern for constipation and mild fecal impaction.  The bladder is mildly distended and grossly unremarkable. The patient is status post hysterectomy. No suspicious adnexal masses are seen. No inguinal lymphadenopathy is seen.  No acute osseous abnormalities are identified. Facet disease is  noted along the lumbar spine.  IMPRESSION: 1. Mild focal right basilar airspace opacity raises concern for pneumonia. 2. Suggestion of mild intrahepatic biliary ductal dilatation, only minimally more prominent than on the prior study. Common bile duct measures up to 1.0 cm in diameter, mildly dilated for age. Trace pericholecystic fluid noted. This could reflect mild intermittent distal obstruction, though the patient's bilirubin has decreased since May. If the patient's symptoms persist, MRCP or ERCP could be considered for further evaluation. 3. Colon largely filled with stool. Rectum distended to 9.4 cm in transverse dimension with stool, raising concern for constipation and mild fecal impaction. 4. Mild retroperitoneal edema is somewhat more prominent than on the prior study  and of uncertain significance, with associated nodes measuring up to 8 mm in short axis, relatively stable in size. 5. Unusual collection of calcified nodes within the mesentery at the left mid abdomen, possibly reflecting remote granulomatous disease. Sequelae of prior tuberculosis infection could have a similar appearance; would correlate with the patient's history. 6. Scattered calcification along the abdominal aorta and its branches. 7. Small right renal cyst noted.   Electronically Signed   By: Roanna Raider M.D.   On: 11/13/2014 21:50   Mr 3d Recon At Scanner  11/14/2014   CLINICAL DATA:  Sepsis with hepatic failure. Patient is confused and could not follow peripheral commands.  EXAM: MRI ABDOMEN WITHOUT AND WITH CONTRAST (INCLUDING MRCP)  TECHNIQUE: Multiplanar multisequence MR imaging of the abdomen was performed both before and after the administration of intravenous contrast. Heavily T2-weighted images of the biliary and pancreatic ducts were obtained, and three-dimensional MRCP images were rendered by post processing.  CONTRAST:  10mL MULTIHANCE GADOBENATE DIMEGLUMINE 529 MG/ML IV SOLN  COMPARISON:  CT 11/13/2014  FINDINGS: Lower chest:  Small bilateral pleural effusions.  Hepatobiliary: No intrahepatic biliary duct dilatation. The MRCP sequences are severely degraded by patient respiratory motion. Patient was confused and could not follow voice commands which is required for MRI imaging. No gross biliary abnormality.  Pancreas: Normal pancreatic parenchymal intensity. No ductal dilatation or inflammation.  Spleen: Normal spleen.  Adrenals/urinary tract: Adrenal glands and kidneys are normal.  Stomach/Bowel: Stomach and limited of the small bowel is unremarkable  Vascular/Lymphatic: Abdominal aortic normal caliber. No retroperitoneal periportal lymphadenopathy.  Musculoskeletal: No aggressive osseous lesion  IMPRESSION: 1. No hepatic abnormality.  No biliary duct dilatation. 2. MRCP sequences are  limited due to patient inability to follow commands. No gross biliary abnormality. 3. Small bilateral pleural effusions.   Electronically Signed   By: Genevive Bi M.D.   On: 11/14/2014 15:51   Dg Chest Port 1 View  11/13/2014   CLINICAL DATA:  Code sepsis, fever  EXAM: PORTABLE CHEST - 1 VIEW  COMPARISON:  10/11/2014  FINDINGS: Increased interstitial markings. No focal consolidation. No pleural effusion or pneumothorax.  The heart is normal in size.  IMPRESSION: No evidence of acute cardiopulmonary disease.   Electronically Signed   By: Charline Bills M.D.   On: 11/13/2014 19:04   Mr Abd W/wo Cm/mrcp  11/14/2014   CLINICAL DATA:  Sepsis with hepatic failure. Patient is confused and could not follow peripheral commands.  EXAM: MRI ABDOMEN WITHOUT AND WITH CONTRAST (INCLUDING MRCP)  TECHNIQUE: Multiplanar multisequence MR imaging of the abdomen was performed both before and after the administration of intravenous contrast. Heavily T2-weighted images of the biliary and pancreatic ducts were obtained, and three-dimensional MRCP images were rendered by post processing.  CONTRAST:  10mL MULTIHANCE GADOBENATE DIMEGLUMINE 529  MG/ML IV SOLN  COMPARISON:  CT 11/13/2014  FINDINGS: Lower chest:  Small bilateral pleural effusions.  Hepatobiliary: No intrahepatic biliary duct dilatation. The MRCP sequences are severely degraded by patient respiratory motion. Patient was confused and could not follow voice commands which is required for MRI imaging. No gross biliary abnormality.  Pancreas: Normal pancreatic parenchymal intensity. No ductal dilatation or inflammation.  Spleen: Normal spleen.  Adrenals/urinary tract: Adrenal glands and kidneys are normal.  Stomach/Bowel: Stomach and limited of the small bowel is unremarkable  Vascular/Lymphatic: Abdominal aortic normal caliber. No retroperitoneal periportal lymphadenopathy.  Musculoskeletal: No aggressive osseous lesion  IMPRESSION: 1. No hepatic abnormality.  No  biliary duct dilatation. 2. MRCP sequences are limited due to patient inability to follow commands. No gross biliary abnormality. 3. Small bilateral pleural effusions.   Electronically Signed   By: Genevive Bi M.D.   On: 11/14/2014 15:51    Microbiology: Recent Results (from the past 240 hour(s))  Blood culture (routine x 2)     Status: None   Collection Time: 11/13/14  5:40 PM  Result Value Ref Range Status   Specimen Description BLOOD LEFT ARM  Final   Special Requests   Final    BOTTLES DRAWN AEROBIC AND ANAEROBIC AEB=10CC ANA=8CC   Culture   Final    ESCHERICHIA COLI Note: SUSCEPTIBILITIES PERFORMED ON PREVIOUS CULTURE WITHIN THE LAST 5 DAYS. Note: Gram Stain Report Called to,Read Back By and Verified With: CINDY PHILLIPS RN @ 3:33 PM 11/14/14 BY DWEEKS Performed at [facility]    Report Status 11/16/2014 FINAL  Final  Blood culture (routine x 2)     Status: None   Collection Time: 11/13/14  5:45 PM  Result Value Ref Range Status   Specimen Description BLOOD LEFT HAND  Final   Special Requests BOTTLES DRAWN AEROBIC AND ANAEROBIC 12CC  Final   Culture   Final    ESCHERICHIA COLI Note: Gram Stain Report Called to,Read Back By and Verified With: CINDY PHILLIPS RN @ 3:33 PM 11/14/14 BY DWEEKS Performed at [facility]    Report Status 11/16/2014 FINAL  Final   Organism ID, Bacteria ESCHERICHIA COLI  Final      Susceptibility   Escherichia coli - MIC*    AMPICILLIN 4 SENSITIVE Sensitive     AMPICILLIN/SULBACTAM 4 SENSITIVE Sensitive     CEFAZOLIN <=4 SENSITIVE Sensitive     CEFEPIME <=1 SENSITIVE Sensitive     CEFTAZIDIME <=1 SENSITIVE Sensitive     CEFTRIAXONE <=1 SENSITIVE Sensitive     CIPROFLOXACIN <=0.25 SENSITIVE Sensitive     GENTAMICIN <=1 SENSITIVE Sensitive     IMIPENEM <=0.25 SENSITIVE Sensitive     PIP/TAZO <=4 SENSITIVE Sensitive     TOBRAMYCIN <=1 SENSITIVE Sensitive     TRIMETH/SULFA <=20 SENSITIVE Sensitive     * ESCHERICHIA COLI  Urine  culture     Status: None   Collection Time: 11/13/14  7:19 PM  Result Value Ref Range Status   Specimen Description URINE, CATHETERIZED  Final   Special Requests NONE  Final   Colony Count NO GROWTH Performed at [facility]   Final   Culture NO GROWTH Performed at [facility]   Final   Report Status 11/15/2014 FINAL  Final  MRSA PCR Screening     Status: None   Collection Time: 11/14/14 12:40 AM  Result Value Ref Range Status   MRSA by PCR NEGATIVE NEGATIVE Final    Comment:  The GeneXpert MRSA Assay (FDA approved for NASAL specimens only), is one component of a comprehensive MRSA colonization surveillance program. It is not intended to diagnose MRSA infection nor to guide or monitor treatment for MRSA infections.      Labs: Basic Metabolic Panel:  Recent Labs Lab 11/13/14 1745 11/14/14 0600 11/15/14 0639 11/16/14 0550 11/17/14 0608  NA 138 140 138 143 140  K 3.6 3.7 3.5 3.2* 3.8  CL 101 107 107 109 106  CO2 28 25 25 27 28   GLUCOSE 140* 92 92 95 97  BUN 19 16 26* 10 11  CREATININE 0.68 0.60 0.84 0.50 0.50  CALCIUM 9.3 8.6* 8.2* 8.6* 8.8*   Liver Function Tests:  Recent Labs Lab 11/13/14 1745 11/14/14 0600 11/15/14 0639 11/17/14 0608  AST 466* 204* 91* 29  ALT 244* 180* 110* 59*  ALKPHOS 174* 133* 94 96  BILITOT 1.9* 3.6* 2.0* 1.0  PROT 7.4 6.5 5.9* 6.6  ALBUMIN 3.4* 2.9* 2.5* 2.5*    Recent Labs Lab 11/13/14 1745  LIPASE 23   No results for input(s): AMMONIA in the last 168 hours. CBC:  Recent Labs Lab 11/13/14 1745 11/14/14 0600 11/15/14 0639 11/16/14 0550 11/17/14 0608  WBC 8.5 14.0* 16.3* 14.1* 11.8*  NEUTROABS 8.2* 12.9*  --   --   --   HGB 11.9* 10.7* 11.1* 11.2* 11.3*  HCT 36.2 33.0* 33.8* 34.0* 33.7*  MCV 97.1 97.1 95.5 95.5 95.2  PLT 112* 100* 82* 92* 96*   Cardiac Enzymes: No results for input(s): CKTOTAL, CKMB, CKMBINDEX, TROPONINI in the last 168 hours. BNP: BNP (last 3 results) No results for  input(s): BNP in the last 8760 hours.  ProBNP (last 3 results) No results for input(s): PROBNP in the last 8760 hours.  CBG: No results for input(s): GLUCAP in the last 168 hours.     Signed:  Azarya Oconnell  [facility] 11/17/2014, 12:37 PM

## 2014-11-18 NOTE — Progress Notes (Signed)
Quick Note:  +ANA, speckled pattern. To discuss with [facility]. ______

## 2014-11-22 ENCOUNTER — Encounter (HOSPITAL_COMMUNITY): Payer: Self-pay | Admitting: *Deleted

## 2014-11-23 ENCOUNTER — Emergency Department (HOSPITAL_COMMUNITY)
Admission: EM | Admit: 2014-11-23 | Discharge: 2014-11-23 | Disposition: A | Discharge status: Home / Self Care | Payer: MEDICARE | Attending: Emergency Medicine | Admitting: Emergency Medicine

## 2014-11-23 ENCOUNTER — Encounter (HOSPITAL_COMMUNITY): Payer: Self-pay | Admitting: Emergency Medicine

## 2014-11-23 DIAGNOSIS — Z8701 Personal history of pneumonia (recurrent): Secondary | ICD-10-CM | POA: Diagnosis not present

## 2014-11-23 DIAGNOSIS — I10 Essential (primary) hypertension: Secondary | ICD-10-CM | POA: Diagnosis not present

## 2014-11-23 DIAGNOSIS — G4700 Insomnia, unspecified: Secondary | ICD-10-CM | POA: Diagnosis not present

## 2014-11-23 DIAGNOSIS — F259 Schizoaffective disorder, unspecified: Secondary | ICD-10-CM | POA: Diagnosis not present

## 2014-11-23 DIAGNOSIS — E559 Vitamin D deficiency, unspecified: Secondary | ICD-10-CM | POA: Diagnosis not present

## 2014-11-23 DIAGNOSIS — F0280 Dementia in other diseases classified elsewhere without behavioral disturbance: Secondary | ICD-10-CM | POA: Diagnosis not present

## 2014-11-23 DIAGNOSIS — Z792 Long term (current) use of antibiotics: Secondary | ICD-10-CM | POA: Diagnosis not present

## 2014-11-23 DIAGNOSIS — Z79899 Other long term (current) drug therapy: Secondary | ICD-10-CM | POA: Diagnosis not present

## 2014-11-23 DIAGNOSIS — Z7982 Long term (current) use of aspirin: Secondary | ICD-10-CM | POA: Diagnosis not present

## 2014-11-23 DIAGNOSIS — E785 Hyperlipidemia, unspecified: Secondary | ICD-10-CM | POA: Diagnosis not present

## 2014-11-23 DIAGNOSIS — K219 Gastro-esophageal reflux disease without esophagitis: Secondary | ICD-10-CM | POA: Diagnosis not present

## 2014-11-23 DIAGNOSIS — E860 Dehydration: Secondary | ICD-10-CM | POA: Diagnosis present

## 2014-11-23 DIAGNOSIS — G309 Alzheimer's disease, unspecified: Secondary | ICD-10-CM | POA: Insufficient documentation

## 2014-11-23 LAB — BASIC METABOLIC PANEL
ANION GAP: 9 (ref 5–15)
BUN: 16 mg/dL (ref 6–20)
CO2: 25 mmol/L (ref 22–32)
CREATININE: 0.91 mg/dL (ref 0.44–1.00)
Calcium: 9.3 mg/dL (ref 8.9–10.3)
Chloride: 99 mmol/L — ABNORMAL LOW (ref 101–111)
GFR calc Af Amer: >60 mL/min (ref >60)
GFR, EST NON AFRICAN AMERICAN: 56 mL/min — AB (ref >60)
Glucose, Bld: 88 mg/dL (ref 65–99)
Potassium: 4.9 mmol/L (ref 3.5–5.1)
SODIUM: 133 mmol/L — AB (ref 135–145)

## 2014-11-23 LAB — CBC WITH DIFFERENTIAL/PLATELET
Basophils Absolute: 0 10*3/uL (ref 0.0–0.1)
Basophils Relative: 1 % (ref 0–1)
EOS PCT: 2 % (ref 0–5)
Eosinophils Absolute: 0.1 10*3/uL (ref 0.0–0.7)
HCT: 34 % — ABNORMAL LOW (ref 36.0–46.0)
Hemoglobin: 11.2 g/dL — ABNORMAL LOW (ref 12.0–15.0)
LYMPHS ABS: 1.1 10*3/uL (ref 0.7–4.0)
LYMPHS PCT: 18 % (ref 12–46)
MCH: 32 pg (ref 26.0–34.0)
MCHC: 32.9 g/dL (ref 30.0–36.0)
MCV: 97.1 fL (ref 78.0–100.0)
MONO ABS: 0.7 10*3/uL (ref 0.1–1.0)
Monocytes Relative: 12 % (ref 3–12)
NEUTROS ABS: 4 10*3/uL (ref 1.7–7.7)
Neutrophils Relative %: 68 % (ref 43–77)
Platelets: 208 10*3/uL (ref 150–400)
RBC: 3.5 MIL/uL — AB (ref 3.87–5.11)
RDW: 14.3 % (ref 11.5–15.5)
WBC: 5.9 10*3/uL (ref 4.0–10.5)

## 2014-11-23 MED ORDER — SODIUM CHLORIDE 0.9 % IV BOLUS (SEPSIS)
500.0000 mL | Freq: Once | INTRAVENOUS | Status: AC
  Administered 2014-11-23: 500 mL via INTRAVENOUS

## 2014-11-23 MED ORDER — SODIUM CHLORIDE 0.9 % IV SOLN
INTRAVENOUS | Status: DC
  Administered 2014-11-23: 15:00:00 via INTRAVENOUS

## 2014-11-23 NOTE — ED Notes (Signed)
MD Zackowsky at bedside.  

## 2014-11-23 NOTE — Progress Notes (Signed)
Quick Note:  I called and spoke to Drake Leach, caregiver/administrator at a facility [phone]). She is aware to have pt follow with PCP [facility]).  She said pt will see Dr. Dulce Sellar on 11/29/2014.  They will cal PCP and schedule follow up after Dr. Dulce Sellar appt. Routing to Darl Pikes to send labs to PCP. ______

## 2014-11-23 NOTE — ED Notes (Signed)
Per caregiver-Pt recently admitted and treated for pne and fecal impaction (dicharged 11/17/14). Pt started go lightly prep yesterday. Pt seen by home health nurse at adult care home and sent patient to ED for possible dehydration.

## 2014-11-23 NOTE — ED Provider Notes (Addendum)
CSN: [id number]     Arrival date & time 11/23/14  1257 History   First MD Initiated Contact with Patient 11/23/14 1357     Chief Complaint  Patient presents with  . Dehydration     (Consider location/radiation/quality/duration/timing/severity/associated sxs/prior Treatment) The history is provided by the patient and a relative.   81 year old female does have a history of dementia but able to answer questions. Patient has a home health care nurse was concerned that she was dehydrated. Patient is undergoing of part of a GoLYTELY bowel prep for procedure to be done by GI medicine next week. Patient normally has a problem with a lot of constipation. Patient denies any chest pain shortness of breath abdominal pain severe headache. There have been no falls. No fevers. Patient has a history of dementia will answer some questions but other times just mumbles. Most questions answered by family member.  Past Medical History  Diagnosis Date  . HTN (hypertension)   . Hyperlipemia   . Seizures   . GERD (gastroesophageal reflux disease)   . Alzheimer's dementia   . Insomnia   . Vitamin D deficiency   . Schizoaffective disorder   . Pneumonia     recent d/c from [facility] related to this  . History of blood culture     11-13-14 "E. Coli" -hospitalized at [facility]  . Mobility impaired     11-22-14 presently too weak for ambulation  . Speech articulation disorder     pt. not always verbal per [facility] care Staff.   Past Surgical History  Procedure Laterality Date  . Stomach surgery     No family history on file. History  Substance Use Topics  . Smoking status: Never Smoker   . Smokeless tobacco: Not on file  . Alcohol Use: No   OB History    No data available     Review of Systems  Unable to perform ROS Psychiatric/Behavioral: Positive for confusion.   correction level V caveat applies due to patient's dementia. Patient does a lot of mumbling.    Allergies  Review of patient's allergies  indicates no known allergies.  Home Medications   Prior to Admission medications   Medication Sig Start Date End Date Taking? Authorizing Provider  amLODipine (NORVASC) 5 MG tablet Take 1 tablet (5 mg total) by mouth daily. 10/13/14  Yes Jeralyn Bennett, MD  aspirin EC 81 MG tablet Take 81 mg by mouth daily.   Yes Historical Provider, MD  benztropine (COGENTIN) 0.5 MG tablet Take 0.5 mg by mouth 2 (two) times daily.   Yes Historical Provider, MD  bisacodyl (DULCOLAX) 10 MG suppository Place 5 mg rectally 2 (two) times daily.   Yes Historical Provider, MD  cholecalciferol (VITAMIN D) 1000 UNITS tablet Take 1,000 Units by mouth daily.   Yes Historical Provider, MD  clonazePAM (KLONOPIN) 0.5 MG tablet Take 0.5 mg by mouth 2 (two) times daily.   Yes Historical Provider, MD  hydrOXYzine (VISTARIL) 25 MG capsule Take 25 mg by mouth at bedtime.   Yes Historical Provider, MD  Linaclotide Karlene Einstein) 145 MCG CAPS capsule Take 1 capsule (145 mcg total) by mouth daily. 11/17/14  Yes Erick Blinks, MD  pantoprazole (PROTONIX) 40 MG tablet Take 1 tablet (40 mg total) by mouth daily. 11/17/14  Yes Erick Blinks, MD  simvastatin (ZOCOR) 40 MG tablet Take 40 mg by mouth at bedtime.   Yes Historical Provider, MD  sulfamethoxazole-trimethoprim (BACTRIM DS,SEPTRA DS) 800-160 MG per tablet Take 1 tablet by mouth  2 (two) times daily. Until 6/28 11/17/14  Yes Erick Blinks, MD  traZODone (DESYREL) 100 MG tablet Take 200 mg by mouth at bedtime.   Yes Historical Provider, MD  ziprasidone (GEODON) 40 MG capsule Take 40 mg by mouth at bedtime.    Yes Historical Provider, MD  feeding supplement, ENSURE ENLIVE, (ENSURE ENLIVE) LIQD Take 237 mLs by mouth daily. Patient not taking: Reported on 11/23/2014 10/13/14   Jeralyn Bennett, MD   BP 126/56 mmHg  Pulse 97  Temp(Src) 97.6 F (36.4 C)  Resp 18  Ht 5\' 6"  (1.676 m)  Wt 107 lb (48.535 kg)  BMI 17.28 kg/m2  SpO2 99% Physical Exam  Constitutional: She is oriented to  person, place, and time. She appears well-developed. No distress.  HENT:  Head: Normocephalic and atraumatic.  Mucous membranes dry.  Eyes: Conjunctivae and EOM are normal. Pupils are equal, round, and reactive to light.  Neck: Normal range of motion.  Cardiovascular: Normal rate, regular rhythm and normal heart sounds.   No murmur heard. Pulmonary/Chest: Effort normal and breath sounds normal.  Abdominal: Soft. Bowel sounds are normal. There is no tenderness.  Musculoskeletal: Normal range of motion.  Neurological: She is alert and oriented to person, place, and time. No cranial nerve deficit. She exhibits normal muscle tone. Coordination normal.  Skin: Skin is warm. No rash noted.  Nursing note and vitals reviewed.   ED Course  Procedures (including critical care time) Labs Review Labs Reviewed  BASIC METABOLIC PANEL - Abnormal; Notable for the following:    Sodium 133 (*)    Chloride 99 (*)    GFR calc non Af Amer 56 (*)    All other components within normal limits  CBC WITH DIFFERENTIAL/PLATELET - Abnormal; Notable for the following:    RBC 3.50 (*)    Hemoglobin 11.2 (*)    HCT 34.0 (*)    All other components within normal limits   Results for orders placed or performed during the hospital encounter of 11/23/14  Basic metabolic panel  Result Value Ref Range   Sodium 133 (L) 135 - 145 mmol/L   Potassium 4.9 3.5 - 5.1 mmol/L   Chloride 99 (L) 101 - 111 mmol/L   CO2 25 22 - 32 mmol/L   Glucose, Bld 88 65 - 99 mg/dL   BUN 16 6 - 20 mg/dL   Creatinine, Ser 8.36 0.44 - 1.00 mg/dL   Calcium 9.3 8.9 - 62.9 mg/dL   GFR calc non Af Amer 56 (L) >60 mL/min   GFR calc Af Amer >60 >60 mL/min   Anion gap 9 5 - 15  CBC with Differential/Platelet  Result Value Ref Range   WBC 5.9 4.0 - 10.5 K/uL   RBC 3.50 (L) 3.87 - 5.11 MIL/uL   Hemoglobin 11.2 (L) 12.0 - 15.0 g/dL   HCT 47.6 (L) 54.6 - 50.3 %   MCV 97.1 78.0 - 100.0 fL   MCH 32.0 26.0 - 34.0 pg   MCHC 32.9 30.0 - 36.0  g/dL   RDW 54.6 56.8 - 12.7 %   Platelets 208 150 - 400 K/uL   Neutrophils Relative % 68 43 - 77 %   Neutro Abs 4.0 1.7 - 7.7 K/uL   Lymphocytes Relative 18 12 - 46 %   Lymphs Abs 1.1 0.7 - 4.0 K/uL   Monocytes Relative 12 3 - 12 %   Monocytes Absolute 0.7 0.1 - 1.0 K/uL   Eosinophils Relative 2 0 - 5 %  Eosinophils Absolute 0.1 0.0 - 0.7 K/uL   Basophils Relative 1 0 - 1 %   Basophils Absolute 0.0 0.0 - 0.1 K/uL   WBC Morphology WHITE COUNT CONFIRMED ON SMEAR      Imaging Review No results found.   EKG Interpretation None      MDM   Final diagnoses:  Dehydration     Patient hydrated here with normal saline. Patient clinically with some mild dehydration part of the bowel prep with GoLYTELY pending the upper endoscopy planned by GI medicine. Most people planning a colonoscopy as well. The patient's been known to have the constipation problems. Patient with a mild hyponatremia sodium 133. Patient's BUN and creatinine without significant abnormalities. No significant leukocytosis no significant anemia. Patient feeling better and looking more hydrated following the IV normal saline. Will follow up with Dr. Dulce Sellar her GI doctor. Patient encouraged to continue to drink fluids well. Patient stable for discharge home.    Vanetta Mulders, MD 11/23/14 1818  Vanetta Mulders, MD 11/23/14 [phone]

## 2014-11-23 NOTE — Discharge Instructions (Signed)
Dehydration Dehydration means your body does not have as much fluid as it needs. Your kidneys, brain, and heart will not work properly without the right amount of fluids and salt. Older adults are more likely to become dehydrated than younger adults. This is because:   Their bodies do not hold water as well.  Their bodies do not respond to temperature changes as well.  They do not get thirsty as easily or as quickly. HOME CARE  Ask your doctor how to replace body fluid losses (rehydrate).  Drink enough fluids to keep your pee (urine) clear or pale yellow.  Drink small amounts of fluids often if you feel sick to your stomach (nauseous) or throw up (vomit).  Eat like you normally do.  Avoid:  Foods or drinks high in sugar.  Bubbly (carbonated) drinks.  Juice.  Very hot or cold fluids.  Drinks with caffeine.  Fatty, greasy foods.  Alcohol.  Tobacco.  Eating too much.  Gelatin desserts.  Wash your hands to avoid spreading germs (bacteria, viruses).  Only take medicine as told by your doctor.  Keep all doctor visits as told. GET HELP IF:  You have belly (abdominal) pain that gets worse or stays in one spot (localizes).  You have a rash, stiff neck, or bad headache.  You get easily annoyed, sleepy, or are hard to wake up.  You feel weak, dizzy, or very thirsty.  You have a fever. GET HELP RIGHT AWAY IF:   You cannot drink fluid without throwing up.  You get worse even with treatment.  You throw up often.  You have watery poop (diarrhea) often.  Your vomit has blood in it or looks greenish.  Your poop (stool) has blood in it or looks black and tarry.  You have not peed in 6 to 8 hours or have only peed a small amount of very dark pee.  You pass out (faint). MAKE SURE YOU:   Understand these instructions.  Will watch your condition.  Will get help right away if you are not doing well or get worse. Document Released: 05/08/2011 Document Revised:  05/24/2013 Document Reviewed: 01/24/2013 [facility] Patient Information 2015 [place], [place]. This information is not intended to replace advice given to you by your health care provider. Make sure you discuss any questions you have with your health care provider.  Continue to hydrate well with liquids. Continue workup for the upper endoscopy scheduled for next week. Return for any new or worse symptoms.

## 2014-11-27 ENCOUNTER — Other Ambulatory Visit: Payer: Self-pay | Admitting: Gastroenterology

## 2014-11-27 NOTE — Addendum Note (Signed)
Addended by: Yan Okray on: 11/27/2014 03:50 PM   Modules accepted: Orders  

## 2014-11-29 ENCOUNTER — Encounter (HOSPITAL_COMMUNITY): Payer: Self-pay

## 2014-11-29 ENCOUNTER — Other Ambulatory Visit: Payer: Self-pay

## 2014-11-29 ENCOUNTER — Encounter (HOSPITAL_COMMUNITY)
Admission: RE | Disposition: A | Discharge status: Home / Self Care | Payer: Medicare Other | Source: Ambulatory Visit | Attending: Gastroenterology

## 2014-11-29 ENCOUNTER — Telehealth: Payer: Self-pay | Admitting: Gastroenterology

## 2014-11-29 ENCOUNTER — Ambulatory Visit (HOSPITAL_COMMUNITY): Payer: MEDICARE | Admitting: Anesthesiology

## 2014-11-29 ENCOUNTER — Ambulatory Visit (HOSPITAL_COMMUNITY)
Admission: RE | Admit: 2014-11-29 | Discharge: 2014-11-29 | Disposition: A | Discharge status: Home / Self Care | Payer: MEDICARE | Source: Ambulatory Visit | Attending: Gastroenterology | Admitting: Gastroenterology

## 2014-11-29 DIAGNOSIS — R7989 Other specified abnormal findings of blood chemistry: Secondary | ICD-10-CM | POA: Insufficient documentation

## 2014-11-29 DIAGNOSIS — K219 Gastro-esophageal reflux disease without esophagitis: Secondary | ICD-10-CM | POA: Diagnosis not present

## 2014-11-29 DIAGNOSIS — Z79899 Other long term (current) drug therapy: Secondary | ICD-10-CM | POA: Diagnosis not present

## 2014-11-29 DIAGNOSIS — F209 Schizophrenia, unspecified: Secondary | ICD-10-CM | POA: Insufficient documentation

## 2014-11-29 DIAGNOSIS — K828 Other specified diseases of gallbladder: Secondary | ICD-10-CM | POA: Diagnosis not present

## 2014-11-29 DIAGNOSIS — R7881 Bacteremia: Secondary | ICD-10-CM

## 2014-11-29 DIAGNOSIS — K820 Obstruction of gallbladder: Secondary | ICD-10-CM | POA: Diagnosis not present

## 2014-11-29 DIAGNOSIS — R109 Unspecified abdominal pain: Secondary | ICD-10-CM | POA: Diagnosis present

## 2014-11-29 DIAGNOSIS — I10 Essential (primary) hypertension: Secondary | ICD-10-CM | POA: Diagnosis not present

## 2014-11-29 DIAGNOSIS — F0390 Unspecified dementia without behavioral disturbance: Secondary | ICD-10-CM | POA: Diagnosis not present

## 2014-11-29 DIAGNOSIS — R509 Fever, unspecified: Secondary | ICD-10-CM | POA: Diagnosis present

## 2014-11-29 HISTORY — DX: Other reduced mobility: Z74.09

## 2014-11-29 HISTORY — PX: EUS: SHX5427

## 2014-11-29 HISTORY — DX: Personal history of other medical treatment: Z92.89

## 2014-11-29 HISTORY — DX: Phonological disorder: F80.0

## 2014-11-29 HISTORY — DX: Pneumonia, unspecified organism: J18.9

## 2014-11-29 SURGERY — ESOPHAGEAL ENDOSCOPIC ULTRASOUND (EUS) RADIAL
Anesthesia: Monitor Anesthesia Care

## 2014-11-29 MED ORDER — SODIUM CHLORIDE 0.9 % IV SOLN: INTRAVENOUS | Status: DC

## 2014-11-29 MED ORDER — PROPOFOL 10 MG/ML IV BOLUS
INTRAVENOUS | Status: DC | PRN
  Administered 2014-11-29: 50 mg via INTRAVENOUS
  Administered 2014-11-29: 25 mg via INTRAVENOUS
  Administered 2014-11-29: 50 mg via INTRAVENOUS
  Administered 2014-11-29: 25 mg via INTRAVENOUS
  Administered 2014-11-29: 50 mg via INTRAVENOUS

## 2014-11-29 MED ORDER — PROPOFOL 10 MG/ML IV BOLUS
INTRAVENOUS | Status: AC
  Filled 2014-11-29: qty 20

## 2014-11-29 MED ORDER — LACTATED RINGERS IV SOLN
INTRAVENOUS | Status: DC | PRN
  Administered 2014-11-29 (×2): via INTRAVENOUS

## 2014-11-29 MED ORDER — LACTATED RINGERS IV SOLN
INTRAVENOUS | Status: DC
  Administered 2014-11-29: 1000 mL via INTRAVENOUS

## 2014-11-29 NOTE — Op Note (Signed)
[facility] [address] [place], [id number]   ENDOSCOPIC ULTRASOUND PROCEDURE REPORT  PATIENT: [name] Reynolds, [name]  MR#: [id number] BIRTHDATE: 04/01/1929  GENDER: female ENDOSCOPIST: Willis ModenaWilliam Aissata Wilmore, MD REFERRED BY:  Jonette EvaSandi Fields, M.D. PROCEDURE DATE:  11/29/2014 PROCEDURE:   Upper EUS ASA CLASS:      Class III INDICATIONS:   1.  abdominal pain, elevated LFTs, fevers, gallstones. MEDICATIONS: Monitored anesthesia care and cetacaine spray  DESCRIPTION OF PROCEDURE:   After the risks benefits and alternatives of the procedure were  explained, informed consent was obtained. The patient was then placed in the left, lateral, decubitus postion and IV sedation was administered. Throughout the procedure, the patients blood pressure, pulse and oxygen saturations were monitored continuously.  Under direct visualization, the radial forward-viewing  echoendoscope was introduced through the mouth  and advanced to the second portion of the duodenum .  Water was used as necessary to provide an acoustic interface. Estimated blood loss is zero unless otherwise noted in this procedure report. Upon completion of the imaging, water was removed and the patient was sent to the recovery room in satisfactory condition.    FINDINGS:      Extrahepatic bile duct was well-evaluated on this examination.  Bile duct was non-dilated and otherwise normal-appearing; no bile duct wall thickening, bile duct mass, or choledocholithiasis was seen.  Ampulla was normal-appearing via EUS.  Gallbladder wall contracted but did have thickened wall.  No pericholecystic fluid noted.  Head and uncinate pancreas were normal-appearing without mass or inflammatory changes.  IMPRESSION:     As above.  No evidence of choledocholithiasis seen on today's examination.  RECOMMENDATIONS:     1.  Watch for potential complications of procedure. 2.  Based on today's evaluation, and normal LFTs last week, there is no  role for ERCP at the present time. 3.  Will discuss with Dr. Darrick PennaFields.   _______________________________ Rosalie DoctoreSignedWillis Modena:  [name] Gangwer, MD 11/29/2014 9:30 AM   CC:

## 2014-11-29 NOTE — Telephone Encounter (Signed)
PT HAD EUS TODAY. NO CBD STONE.  PLEASE CALL PT'S NIECE. PT NEEDS APPT WITH INFECTIOUS DISEASE CLINIC BECAUSE WE DO NOT HAVE A SOURCE FOR THE BACTERIA IN HER BLOOD( IN [place] OR [place] Dx: E. COLI BACTEREMIA SOURCE UNKNOWN).

## 2014-11-29 NOTE — Anesthesia Postprocedure Evaluation (Signed)
Anesthesia Post Note  Patient: [name] Reynolds  Procedure(s) Performed: Procedure(s) (LRB): ESOPHAGEAL ENDOSCOPIC ULTRASOUND (EUS) RADIAL (N/A)  Anesthesia type: MAC  Patient location: PACU  Post pain: Pain level controlled  Post assessment: Patient's Cardiovascular Status Stable  Last Vitals:  Filed Vitals:   11/29/14 0930  BP: 132/59  Pulse: 101  Temp:   Resp: 14    Post vital signs: Reviewed and stable  Level of consciousness: sedated  Complications: No apparent anesthesia complications

## 2014-11-29 NOTE — Anesthesia Preprocedure Evaluation (Signed)
Anesthesia Evaluation  Patient identified by MRN, date of birth, ID band Patient confused    Reviewed: Allergy & Precautions, NPO status , Patient's Chart, lab work & pertinent test results  Airway Mallampati: I  TM Distance: >3 FB Neck ROM: Full    Dental   Pulmonary    Pulmonary exam normal       Cardiovascular hypertension, Pt. on medications Normal cardiovascular exam    Neuro/Psych Schizophrenia Dementia   GI/Hepatic GERD-  Medicated and Controlled,  Endo/Other    Renal/GU      Musculoskeletal   Abdominal   Peds  Hematology   Anesthesia Other Findings   Reproductive/Obstetrics                             Anesthesia Physical Anesthesia Plan  ASA: III  Anesthesia Plan: MAC   Post-op Pain Management:    Induction: Intravenous  Airway Management Planned: Natural Airway  Additional Equipment:   Intra-op Plan:   Post-operative Plan:   Informed Consent: I have reviewed the patients History and Physical, chart, labs and discussed the procedure including the risks, benefits and alternatives for the proposed anesthesia with the patient or authorized representative who has indicated his/her understanding and acceptance.     Plan Discussed with: CRNA and Surgeon  Anesthesia Plan Comments:         Anesthesia Quick Evaluation

## 2014-11-29 NOTE — Telephone Encounter (Signed)
Referral sent and sister is aware of recommendations

## 2014-11-29 NOTE — Transfer of Care (Signed)
Immediate Anesthesia Transfer of Care Note  Patient: [name] Reynolds  Procedure(s) Performed: Procedure(s): ESOPHAGEAL ENDOSCOPIC ULTRASOUND (EUS) RADIAL (N/A)  Patient Location: PACU and Endoscopy Unit  Anesthesia Type:MAC  Level of Consciousness: awake, sedated and patient cooperative  Airway & Oxygen Therapy: Patient Spontanous Breathing and Patient connected to face mask oxygen  Post-op Assessment: Report given to RN and Post -op Vital signs reviewed and stable  Post vital signs: Reviewed and stable  Last Vitals:  Filed Vitals:   11/29/14 0719  BP: 116/86  Pulse: 100  Temp: 36.4 C  Resp: 24    Complications: No apparent anesthesia complications

## 2014-11-29 NOTE — Discharge Instructions (Signed)
Esophagogastroduodenoscopy °Esophagogastroduodenoscopy (EGD) is a procedure to examine the lining of the esophagus, stomach, and first part of the small intestine (duodenum). A long, flexible, lighted tube with a camera attached (endoscope) is inserted down the throat to view these organs. This procedure is done to detect problems or abnormalities, such as inflammation, bleeding, ulcers, or growths, in order to treat them. The procedure lasts about 5-20 minutes. It is usually an outpatient procedure, but it may need to be performed in emergency cases in the hospital. °LET YOUR CAREGIVER KNOW ABOUT:  °· Allergies to food or medicine. °· All medicines you are taking, including vitamins, herbs, eyedrops, and over-the-counter medicines and creams. °· Use of steroids (by mouth or creams). °· Previous problems you or members of your family have had with the use of anesthetics. °· Any blood disorders you have. °· Previous surgeries you have had. °· Other health problems you have. °· Possibility of pregnancy, if this applies. °RISKS AND COMPLICATIONS  °Generally, EGD is a safe procedure. However, as with any procedure, complications can occur. Possible complications include: °· Infection. °· Bleeding. °· Tearing (perforation) of the esophagus, stomach, or duodenum. °· Difficulty breathing or not being able to breath. °· Excessive sweating. °· Spasms of the larynx. °· Slowed heartbeat. °· Low blood pressure. °BEFORE THE PROCEDURE °· Do not eat or drink anything for 6-8 hours before the procedure or as directed by your caregiver. °· Ask your caregiver about changing or stopping your regular medicines. °· If you wear dentures, be prepared to remove them before the procedure. °· Arrange for someone to drive you home after the procedure. °PROCEDURE  °· A vein will be accessed to give medicines and fluids. A medicine to relax you (sedative) and a pain reliever will be given through that access into the vein. °· A numbing medicine  (local anesthetic) may be sprayed on your throat for comfort and to stop you from gagging or coughing. °· A mouth guard may be placed in your mouth to protect your teeth and to keep you from biting on the endoscope. °· You will be asked to lie on your left side. °· The endoscope is inserted down your throat and into the esophagus, stomach, and duodenum. °· Air is put through the endoscope to allow your caregiver to view the lining of your esophagus clearly. °· The esophagus, stomach, and duodenum is then examined. During the exam, your caregiver may: °¨ Remove tissue to be examined under a microscope (biopsy) for inflammation, infection, or other medical problems. °¨ Remove growths. °¨ Remove objects (foreign bodies) that are stuck. °¨ Treat any bleeding with medicines or other devices that stop tissues from bleeding (hot cautery, clipping devices). °¨ Widen (dilate) or stretch narrowed areas of the esophagus and stomach. °· The endoscope will then be withdrawn. °AFTER THE PROCEDURE °· You will be taken to a recovery area to be monitored. You will be able to go home once you are stable and alert. °· Do not eat or drink anything until the local anesthetic and numbing medicines have worn off. You may choke. °· It is normal to feel bloated, have pain with swallowing, or have a sore throat for a short time. This will wear off. °· Your caregiver should be able to discuss his or her findings with you. It will take longer to discuss the test results if any biopsies were taken. °Document Released: 09/19/2004 Document Revised: 10/03/2013 Document Reviewed: 04/21/2012 °[employer]® Patient Information ©2015 [employer]. This information is not   intended to replace advice given to you by your health care provider. Make sure you discuss any questions you have with your health care provider. ° °

## 2014-11-29 NOTE — H&P (Signed)
Patient interval history reviewed.  Patient examined again.  There has been no change from documented H/P dated 11/20/14 (scanned into chart from our office) except as documented above.  Assessment:  1.  Intermittent fevers. 2.  Intermittent elevated LFTs, most recently normal. 3.  Gallstones; question of choledocholithiasis (not definitive on MRCP).  Plan:  1.  Endoscopic ultrasound to assess for choledocholithiasis. 2.  Risks (bleeding, infection, bowel perforation that could require surgery, sedation-related changes in cardiopulmonary systems), benefits (identification and possible treatment of source of symptoms, exclusion of certain causes of symptoms), and alternatives (watchful waiting, radiographic imaging studies, empiric medical treatment) of upper endoscopy with ultrasound (EUS) were explained to patient/family in detail and patient wishes to proceed. 3.  If choledocholithiasis is identified, would proceed with same-day ERCP. 4.  Risks (up to and including bleeding, infection, perforation, pancreatitis that can be complicated by infected necrosis and death), benefits (removal of stones, alleviating blockage, decreasing risk of cholangitis or choledocholithiasis-related pancreatitis), and alternatives (watchful waiting, percutaneous transhepatic cholangiography) of ERCP were explained to patient/family in detail and patient elects to proceed.  Consent signed by family, given patient's dementia.

## 2014-11-30 ENCOUNTER — Encounter (HOSPITAL_COMMUNITY): Payer: Self-pay | Admitting: Gastroenterology

## 2014-12-20 ENCOUNTER — Ambulatory Visit: Payer: Medicare Other | Admitting: Infectious Disease

## 2015-10-03 ENCOUNTER — Emergency Department (HOSPITAL_COMMUNITY): Payer: MEDICARE

## 2015-10-03 ENCOUNTER — Emergency Department (HOSPITAL_COMMUNITY)
Admission: EM | Admit: 2015-10-03 | Discharge: 2015-10-03 | Disposition: A | Discharge status: Home / Self Care | Payer: MEDICARE | Attending: Emergency Medicine | Admitting: Emergency Medicine

## 2015-10-03 ENCOUNTER — Encounter (HOSPITAL_COMMUNITY): Payer: Self-pay | Admitting: *Deleted

## 2015-10-03 DIAGNOSIS — F0390 Unspecified dementia without behavioral disturbance: Secondary | ICD-10-CM | POA: Diagnosis not present

## 2015-10-03 DIAGNOSIS — M21612 Bunion of left foot: Secondary | ICD-10-CM | POA: Insufficient documentation

## 2015-10-03 DIAGNOSIS — E785 Hyperlipidemia, unspecified: Secondary | ICD-10-CM | POA: Diagnosis not present

## 2015-10-03 DIAGNOSIS — Z79899 Other long term (current) drug therapy: Secondary | ICD-10-CM | POA: Insufficient documentation

## 2015-10-03 DIAGNOSIS — Z7982 Long term (current) use of aspirin: Secondary | ICD-10-CM | POA: Insufficient documentation

## 2015-10-03 DIAGNOSIS — I10 Essential (primary) hypertension: Secondary | ICD-10-CM | POA: Diagnosis not present

## 2015-10-03 DIAGNOSIS — M79672 Pain in left foot: Secondary | ICD-10-CM | POA: Diagnosis present

## 2015-10-03 NOTE — ED Provider Notes (Signed)
CSN: [id number]     Arrival date & time 10/03/15  1905 History  By signing my name below, I, Linus Galas, attest that this documentation has been prepared under the direction and in the presence of No att. providers found. Electronically Signed: Linus Galas, ED Scribe. 10/10/2015. 9:14 PM.   Chief Complaint  Patient presents with  . Foot Injury   Level 5 Caveat due to dementia.   The history is provided by the patient and a caregiver. The history is limited by the condition of the patient. No language interpreter was used.   HPI Comments: [name] Reynolds is a 82 y.o. female who presents to the Emergency Department with a PMHx of HTN and dementia complaining of left foot pain s/p fall today. Caregiver states the pt had a witnessed mechanical fall today at the [facility]. Caregiver states the pt was able to ambulate the first part of the day but later was unable to ambulate. This is unusual for the pt since she usually walks briskly. Pt did not hit her head. Caregiver denies any other complaints at this time.   Past Medical History  Diagnosis Date  . HTN (hypertension)   . Hyperlipemia   . Seizures (HCC)   . GERD (gastroesophageal reflux disease)   . Alzheimer's dementia   . Insomnia   . Vitamin D deficiency   . Schizoaffective disorder (HCC)   . Pneumonia     recent d/c from [facility] related to this  . History of blood culture     11-13-14 "E. Coli" -hospitalized at [facility]  . Mobility impaired     11-22-14 presently too weak for ambulation  . Speech articulation disorder     pt. not always verbal per [facility] care Staff.   Past Surgical History  Procedure Laterality Date  . Stomach surgery    . Eus N/A 11/29/2014    Procedure: ESOPHAGEAL ENDOSCOPIC ULTRASOUND (EUS) RADIAL;  Surgeon: Willis Modena, MD;  Location: [facility] ENDOSCOPY;  Service: Endoscopy;  Laterality: N/A;   No family history on file. Social History  Substance Use Topics  . Smoking status: Never Smoker   .  Smokeless tobacco: None  . Alcohol Use: No   OB History    No data available     Review of Systems  Unable to perform ROS: Dementia   Allergies  Review of patient's allergies indicates no known allergies.  Home Medications   Prior to Admission medications   Medication Sig Start Date End Date Taking? Authorizing Provider  amLODipine (NORVASC) 5 MG tablet Take 1 tablet (5 mg total) by mouth daily. 10/13/14   Jeralyn Bennett, MD  aspirin EC 81 MG tablet Take 81 mg by mouth daily.    Historical Provider, MD  benztropine (COGENTIN) 0.5 MG tablet Take 0.5 mg by mouth 2 (two) times daily.    Historical Provider, MD  bisacodyl (DULCOLAX) 10 MG suppository Place 5 mg rectally 2 (two) times daily.    Historical Provider, MD  cholecalciferol (VITAMIN D) 1000 UNITS tablet Take 1,000 Units by mouth daily.    Historical Provider, MD  clonazePAM (KLONOPIN) 0.5 MG tablet Take 0.5 mg by mouth 2 (two) times daily.    Historical Provider, MD  famotidine (PEPCID) 20 MG tablet Take 20 mg by mouth daily.    Historical Provider, MD  feeding supplement, ENSURE ENLIVE, (ENSURE ENLIVE) LIQD Take 237 mLs by mouth daily. 10/13/14   Jeralyn Bennett, MD  hydrOXYzine (VISTARIL) 25 MG capsule Take 25 mg by  mouth at bedtime.    Historical Provider, MD  lactulose, encephalopathy, (GENERLAC) 10 GM/15ML SOLN Take 10 g by mouth 2 (two) times daily.    Historical Provider, MD  Linaclotide Karlene Einstein) 145 MCG CAPS capsule Take 1 capsule (145 mcg total) by mouth daily. 11/17/14   Erick Blinks, MD  memantine (NAMENDA) 5 MG tablet Take 5 mg by mouth 2 (two) times daily.    Historical Provider, MD  pantoprazole (PROTONIX) 40 MG tablet Take 1 tablet (40 mg total) by mouth daily. 11/17/14   Erick Blinks, MD  simvastatin (ZOCOR) 40 MG tablet Take 40 mg by mouth at bedtime.    Historical Provider, MD  sulfamethoxazole-trimethoprim (BACTRIM DS,SEPTRA DS) 800-160 MG per tablet Take 1 tablet by mouth 2 (two) times daily. Until 6/28  11/17/14   Erick Blinks, MD  traZODone (DESYREL) 100 MG tablet Take 100 mg by mouth at bedtime.     Historical Provider, MD   BP 128/67 mmHg  Pulse 106  Temp(Src) 97.6 F (36.4 C) (Oral)  Resp 20  Ht 5\' 7"  (1.702 m)  Wt 108 lb (48.988 kg)  BMI 16.91 kg/m2  SpO2 92%   Physical Exam  Constitutional: She appears well-developed and well-nourished. No distress.  Thin and frail appearing.   HENT:  Head: Normocephalic and atraumatic.  Mouth/Throat: Oropharynx is clear and moist. No oropharyngeal exudate.  Eyes: EOM are normal. Pupils are equal, round, and reactive to light.  Neck: Normal range of motion. Neck supple. No JVD present.  No posterior cervical tenderness  Cardiovascular: Normal rate and regular rhythm.   Pulmonary/Chest: Effort normal and breath sounds normal. No respiratory distress. She has no wheezes. She has no rales. She exhibits no tenderness.  Abdominal: Soft. Bowel sounds are normal. She exhibits no distension and no mass. There is no tenderness. There is no rebound and no guarding.  Musculoskeletal: Normal range of motion. She exhibits tenderness. She exhibits no edema.  Patient has mild erythema over a bunion on the medial surface of the left foot. there is no obvious injury. No warmth or induration.  Neurological: She is alert.  Patient is confused but at her baseline per family. Moving all extremities.  Skin: Skin is warm and dry. No rash noted. No erythema.  Psychiatric: She has a normal mood and affect. Her behavior is normal.  Nursing note and vitals reviewed.   ED Course  Procedures  DIAGNOSTIC STUDIES: Oxygen Saturation is 92% on room air, normal by my interpretation.    COORDINATION OF CARE: 9:11 PM Will order x-ray of left foot. Discussed treatment plan with caregiver at bedside and she agreed to plan.  Labs Review Labs Reviewed - No data to display  Imaging Review Dg Chest Port 1 View  10/10/2015  CLINICAL DATA:  Right-sided pneumothorax, chest  tube treatment. Sepsis, since the hips systemic inflammatory response syndrome cough, healthcare associated pneumonia. EXAM: PORTABLE CHEST 1 VIEW COMPARISON:  Portable chest x-ray of Oct 09, 2015 FINDINGS: The lungs are adequately inflated. The right chest tube has been pulled inferiorly with its tip now lying over the medial aspect of the sixth rib. It formerly overlay the medial aspect of the third rib. No pneumothorax is evident but there is persistent right axillary subcutaneous emphysema. There is no pleural effusion. The left lung is clear. The heart is normal in size. The pulmonary vascularity is not engorged. IMPRESSION: Fairly stable appearance of the chest. A discrete pneumothorax on the right is not observed. There has been interval partial  withdrawal of the chest tube but the proximal port and tip appear to remain within the thoracic cavity. Electronically Signed   By: Sami Roes  [place] M.D.   On: 10/10/2015 07:46   Dg Chest Port 1 View  10/09/2015  CLINICAL DATA:  Follow-up right pneumothorax EXAM: PORTABLE CHEST 1 VIEW COMPARISON:  Two days ago FINDINGS: Right apical chest tube in unchanged position. No visible pneumothorax. Right chest wall gas is extensive but without convincing increase. Volume loss and asymmetric density on the right that is presumably from atelectasis. The left lung is comparatively clear. Normal heart size and aortic contours. IMPRESSION: No visible pneumothorax.  Right chest tube in similar position. Electronically Signed   By: Marnee SpringJonathon  Watts M.D.   On: 10/09/2015 08:03   I have personally reviewed and evaluated these images and lab results as part of my medical decision-making.   EKG Interpretation None      MDM   Final diagnoses:  Bunion of left foot    I personally performed the services described in this documentation, which was scribed in my presence. The recorded information has been reviewed and is accurate.   Advised to follow-up with podiatry. Suggested  larger, more padded shoes. Return precautions given.  Loren Raceravid Khylee Algeo, MD 10/10/15 [phone]

## 2015-10-03 NOTE — ED Notes (Addendum)
Pt is a resident of [facility], brought to er this afternoon for further evaluation of left foot pain, caregiver reports that pt twisted left foot this am while getting out of the shower, per caregiver pt is reluctant to put any pressure on her left foot, pt has redness noted to top of left foot area.

## 2015-10-03 NOTE — ED Notes (Signed)
Ambulated patient. Patient is very unsteady two person assist. She seems to be experiencing discomfort (left foot). She has a calus on the side of her (great toe), the area is a little red.

## 2015-10-07 ENCOUNTER — Emergency Department (HOSPITAL_COMMUNITY): Payer: MEDICARE

## 2015-10-07 ENCOUNTER — Inpatient Hospital Stay (HOSPITAL_COMMUNITY)
Admission: EM | Admit: 2015-10-07 | Discharge: 2015-10-10 | DRG: 201 | Disposition: A | Discharge status: Skilled Nursing Facility | Payer: MEDICARE | Attending: Internal Medicine | Admitting: Internal Medicine

## 2015-10-07 ENCOUNTER — Encounter (HOSPITAL_COMMUNITY): Payer: Self-pay | Admitting: *Deleted

## 2015-10-07 DIAGNOSIS — Z7982 Long term (current) use of aspirin: Secondary | ICD-10-CM

## 2015-10-07 DIAGNOSIS — Z66 Do not resuscitate: Secondary | ICD-10-CM | POA: Diagnosis present

## 2015-10-07 DIAGNOSIS — J930 Spontaneous tension pneumothorax: Secondary | ICD-10-CM | POA: Diagnosis not present

## 2015-10-07 DIAGNOSIS — I10 Essential (primary) hypertension: Secondary | ICD-10-CM | POA: Diagnosis present

## 2015-10-07 DIAGNOSIS — J939 Pneumothorax, unspecified: Secondary | ICD-10-CM | POA: Insufficient documentation

## 2015-10-07 DIAGNOSIS — R0902 Hypoxemia: Secondary | ICD-10-CM | POA: Diagnosis present

## 2015-10-07 DIAGNOSIS — F0280 Dementia in other diseases classified elsewhere without behavioral disturbance: Secondary | ICD-10-CM | POA: Diagnosis present

## 2015-10-07 DIAGNOSIS — K219 Gastro-esophageal reflux disease without esophagitis: Secondary | ICD-10-CM | POA: Diagnosis present

## 2015-10-07 DIAGNOSIS — F259 Schizoaffective disorder, unspecified: Secondary | ICD-10-CM | POA: Diagnosis present

## 2015-10-07 DIAGNOSIS — Z4682 Encounter for fitting and adjustment of non-vascular catheter: Secondary | ICD-10-CM

## 2015-10-07 DIAGNOSIS — E785 Hyperlipidemia, unspecified: Secondary | ICD-10-CM | POA: Diagnosis present

## 2015-10-07 DIAGNOSIS — G309 Alzheimer's disease, unspecified: Secondary | ICD-10-CM | POA: Diagnosis present

## 2015-10-07 LAB — CBC WITH DIFFERENTIAL/PLATELET
BASOS ABS: 0 10*3/uL (ref 0.0–0.1)
BASOS PCT: 0 %
EOS PCT: 1 %
Eosinophils Absolute: 0.1 10*3/uL (ref 0.0–0.7)
HCT: 38.8 % (ref 36.0–46.0)
Hemoglobin: 12.6 g/dL (ref 12.0–15.0)
LYMPHS PCT: 11 %
Lymphs Abs: 0.9 10*3/uL (ref 0.7–4.0)
MCH: 31.8 pg (ref 26.0–34.0)
MCHC: 32.5 g/dL (ref 30.0–36.0)
MCV: 98 fL (ref 78.0–100.0)
Monocytes Absolute: 0.8 10*3/uL (ref 0.1–1.0)
Monocytes Relative: 10 %
Neutro Abs: 6.4 10*3/uL (ref 1.7–7.7)
Neutrophils Relative %: 78 %
PLATELETS: 191 10*3/uL (ref 150–400)
RBC: 3.96 MIL/uL (ref 3.87–5.11)
RDW: 13.6 % (ref 11.5–15.5)
WBC: 8.2 10*3/uL (ref 4.0–10.5)

## 2015-10-07 MED ORDER — BUPIVACAINE HCL (PF) 0.5 % IJ SOLN
INTRAMUSCULAR | Status: AC
  Administered 2015-10-07: 150 mg
  Filled 2015-10-07: qty 30

## 2015-10-07 MED ORDER — LIDOCAINE-EPINEPHRINE 1 %-1:200000 IJ SOLN
INTRAMUSCULAR | Status: AC
Start: 2015-10-07 — End: 2015-10-08
  Filled 2015-10-07: qty 30

## 2015-10-07 MED ORDER — POVIDONE-IODINE 10 % EX SOLN
CUTANEOUS | Status: AC
  Administered 2015-10-07: 22:00:00
  Filled 2015-10-07: qty 118

## 2015-10-07 MED ORDER — LORAZEPAM 2 MG/ML IJ SOLN
1.0000 mg | INTRAMUSCULAR | Status: DC | PRN
  Administered 2015-10-08: 1 mg via INTRAVENOUS
  Filled 2015-10-07: qty 1

## 2015-10-07 MED ORDER — PROPOFOL 10 MG/ML IV BOLUS
INTRAVENOUS | Status: AC
  Administered 2015-10-07: 55 mg
  Filled 2015-10-07: qty 20

## 2015-10-07 MED ORDER — FENTANYL CITRATE (PF) 100 MCG/2ML IJ SOLN
25.0000 ug | Freq: Once | INTRAMUSCULAR | Status: AC
  Administered 2015-10-07: 25 ug via INTRAVENOUS

## 2015-10-07 MED ORDER — ZIPRASIDONE MESYLATE 20 MG IM SOLR: 10.0000 mg | Freq: Once | INTRAMUSCULAR | Status: DC

## 2015-10-07 MED ORDER — ZIPRASIDONE MESYLATE 20 MG IM SOLR
20.0000 mg | Freq: Once | INTRAMUSCULAR | Status: AC
  Administered 2015-10-07: 20 mg via INTRAMUSCULAR
  Filled 2015-10-07: qty 20

## 2015-10-07 MED ORDER — FENTANYL CITRATE (PF) 100 MCG/2ML IJ SOLN
INTRAMUSCULAR | Status: AC
  Administered 2015-10-07: 25 ug
  Filled 2015-10-07: qty 2

## 2015-10-07 MED ORDER — STERILE WATER FOR INJECTION IJ SOLN
INTRAMUSCULAR | Status: AC
  Administered 2015-10-07: 1.2 mL
  Filled 2015-10-07: qty 10

## 2015-10-07 MED ORDER — FENTANYL CITRATE (PF) 100 MCG/2ML IJ SOLN
INTRAMUSCULAR | Status: AC
  Administered 2015-10-07: 25 ug via INTRAVENOUS
  Filled 2015-10-07: qty 2

## 2015-10-07 NOTE — ED Provider Notes (Signed)
CSN: [id number]     Arrival date & time 10/07/15  2120 History  By signing my name below, I, [name], attest that this documentation has been prepared under the direction and in the presence of Rolland Porter, MD.   Electronically Signed: [name], ED Scribe. 10/08/2015. 12:03 AM  Chief Complaint  Patient presents with  . Altered Mental Status   The history is limited by the condition of the patient.  LEVEL 5 CAVEAT: ALTERED MENTAL STATUS HPI Comments: HPI limited due to mental status of patient.  Results from [facility]. They noticed that her "activity was different". She was urinating less today and had a strong odor. They note that she is "moving her arms over the place and can't seem to hold still. No additional report. Paramedics state adamantly that there were no falls or injury. She was sitting apparently in the company of others when her activity change. She has not been ill recently. No new medications.  Past Medical History  Diagnosis Date  . HTN (hypertension)   . Hyperlipemia   . Seizures (HCC)   . GERD (gastroesophageal reflux disease)   . Alzheimer's dementia   . Insomnia   . Vitamin D deficiency   . Schizoaffective disorder (HCC)   . Pneumonia     recent d/c from [facility] related to this  . History of blood culture     11-13-14 "E. Coli" -hospitalized at [facility]  . Mobility impaired     11-22-14 presently too weak for ambulation  . Speech articulation disorder     pt. not always verbal per [facility] care Staff.   Past Surgical History  Procedure Laterality Date  . Stomach surgery    . Eus N/A 11/29/2014    Procedure: ESOPHAGEAL ENDOSCOPIC ULTRASOUND (EUS) RADIAL;  Surgeon: Willis Modena, MD;  Location: [facility] ENDOSCOPY;  Service: Endoscopy;  Laterality: N/A;   No family history on file. Social History  Substance Use Topics  . Smoking status: Never Smoker   . Smokeless tobacco: None  . Alcohol Use: No   OB History    No data available      Review of Systems  Unable to perform ROS: Mental status change     Allergies  Review of patient's allergies indicates no known allergies.  Home Medications   Prior to Admission medications   Medication Sig Start Date End Date Taking? Authorizing Provider  amLODipine (NORVASC) 5 MG tablet Take 1 tablet (5 mg total) by mouth daily. 10/13/14  Yes Jeralyn Bennett, MD  aspirin EC 81 MG tablet Take 81 mg by mouth daily.   Yes Historical Provider, MD  benztropine (COGENTIN) 0.5 MG tablet Take 0.5 mg by mouth 2 (two) times daily.   Yes Historical Provider, MD  cholecalciferol (VITAMIN D) 1000 UNITS tablet Take 1,000 Units by mouth daily.   Yes Historical Provider, MD  clonazePAM (KLONOPIN) 0.5 MG tablet Take 0.5 mg by mouth 2 (two) times daily.   Yes Historical Provider, MD  famotidine (PEPCID) 20 MG tablet Take 20 mg by mouth daily.   Yes Historical Provider, MD  feeding supplement, ENSURE ENLIVE, (ENSURE ENLIVE) LIQD Take 237 mLs by mouth daily. 10/13/14  Yes Jeralyn Bennett, MD  hydrOXYzine (VISTARIL) 25 MG capsule Take 25 mg by mouth at bedtime.   Yes Historical Provider, MD  lactulose, encephalopathy, (GENERLAC) 10 GM/15ML SOLN Take 10 g by mouth 2 (two) times daily.   Yes Historical Provider, MD  Linaclotide Karlene Einstein) 145 MCG CAPS capsule  Take 1 capsule (145 mcg total) by mouth daily. 11/17/14  Yes Erick Blinks, MD  memantine (NAMENDA) 5 MG tablet Take 5 mg by mouth 2 (two) times daily.   Yes Historical Provider, MD  pantoprazole (PROTONIX) 40 MG tablet Take 1 tablet (40 mg total) by mouth daily. 11/17/14  Yes Erick Blinks, MD  simvastatin (ZOCOR) 40 MG tablet Take 40 mg by mouth at bedtime.   Yes Historical Provider, MD  traZODone (DESYREL) 100 MG tablet Take 100 mg by mouth at bedtime.    Yes Historical Provider, MD  bisacodyl (DULCOLAX) 10 MG suppository Place 5 mg rectally 2 (two) times daily.    Historical Provider, MD  sulfamethoxazole-trimethoprim (BACTRIM DS,SEPTRA DS) 800-160  MG per tablet Take 1 tablet by mouth 2 (two) times daily. Until 6/28 11/17/14   Erick Blinks, MD   BP 142/106 mmHg  Pulse 119  Resp 20  Wt 108 lb (48.988 kg)  SpO2 88% Physical Exam  Constitutional: She is oriented to person, place, and time. She appears well-developed and well-nourished. No distress.  Nonverbal at baseline.   HENT:  Head: Normocephalic.  Mouth/Throat: Mucous membranes are not dry.  Lips dry. Mucous membrane moist.   Eyes: Conjunctivae are normal. Pupils are equal, round, and reactive to light. No scleral icterus.  Conjunctiva are not pale.   Neck: Normal range of motion. Neck supple. No thyromegaly present.  Cardiovascular: Regular rhythm.  Tachycardia present.  Exam reveals no gallop and no friction rub.   No murmur heard. Heart rate at 120 bpm on exam.   Pulmonary/Chest: Effort normal. No respiratory distress. She has no wheezes. She has no rales.  Breath sounds absent on right side.   Abdominal: Soft. Bowel sounds are normal. She exhibits no distension. There is no tenderness. There is no rebound.  Musculoskeletal: Normal range of motion.  Generalized increased motor activity.   Neurological: She is alert and oriented to person, place, and time.  Skin: Skin is warm and dry. No rash noted.  Psychiatric: She has a normal mood and affect. Her behavior is normal.    ED Course  CHEST TUBE INSERTION Date/Time: 10/08/2015 12:05 AM Performed by: Rolland Porter Authorized by: Rolland Porter Consent: The procedure was performed in an emergent situation. Verbal consent not obtained. Written consent not obtained. Consent given by: power of attorney Patient understanding: patient does not state understanding of the procedure being performed Patient identity confirmed: arm band Indications: tension pneumothorax Patient sedated: yes Sedatives: propofol Analgesia: fentanyl Sedation start date/time: 10/07/2015 10:00 PM Sedation end date/time: 10/07/2015 10:35 PM Anesthetic  total: 15 ml Preparation: skin prepped with Betadine Placement location: right lateral Scalpel size: 10 Tube size: 32 [place] Dissection instrument: scissors Ultrasound guidance: no Tension pneumothorax heard: yes Tube connected to: suction Drainage characteristics: serosanguinous Drainage amount: 20 ml Suture material: 0 silk Dressing: 4x4 sterile gauze Post-insertion x-ray findings: tube in good position Comments: Patient tolerated procedure fairly well. Sedation was light secondary to patient's DO NOT RESUSCITATE status and wished to avoid mechanical ventilation.   (including critical care time) DIAGNOSTIC STUDIES: Oxygen Saturation is 88% on nasal canula, low by my interpretation.    COORDINATION OF CARE: 9:29 PM Discussed treatment plan which includes DG chest portable 1 view with pt at bedside and pt agreed to plan.  Labs Review Labs Reviewed  URINE CULTURE  CBC WITH DIFFERENTIAL/PLATELET  BASIC METABOLIC PANEL  PROTIME-INR  URINALYSIS, ROUTINE W REFLEX MICROSCOPIC (NOT AT [facility])    Imaging Review Dg Chest Portable  1 View  10/07/2015  CLINICAL DATA:  82 year old female with right-sided pneumothorax status post right sided chest tube placement. EXAM: PORTABLE CHEST 1 VIEW COMPARISON:  Chest radiograph dated 10/07/2015. FINDINGS: There has been interval placement of a right-sided chest tube the tip in the right apical region. There has been interval re-expansion of the right lung. No definite residual pneumothorax identified on this study. Patchy and linear areas of pulmonary density predominantly in the right upper lobe may represent atelectatic changes. The left lung is clear. Top-normal cardiac size. Right chest wall soft tissue emphysema noted. No acute osseous pathology. IMPRESSION: Interval placement of a right-sided chest tube with re-expansion of the right lung. No definite residual pneumothorax identified. Electronically Signed   By: Elgie Collard M.D.   On: 10/07/2015  23:04   Dg Chest Portable 1 View  10/07/2015  CLINICAL DATA:  82 year old female with shortness of breath and altered mental status. EXAM: PORTABLE CHEST 1 VIEW COMPARISON:  Chest radiograph dated 11/13/2014 FINDINGS: There is a large right-sided pneumothorax with complete collapse of the right lung. There is apparent shift of the mediastinum to the left hemithorax which may be partially related to patient rotation. A degree of tension pneumothorax is not excluded. Clinical correlation is recommended. There is increased interstitial markings throughout the left lung. There is no pneumothorax on the left. There is mild cardiomegaly. No acute osseous pathology identified. IMPRESSION: Large right-sided pneumothorax with complete collapse of the right lung and mild shift of the mediastinum into the left hemithorax. Critical Value/emergent results were called by telephone at the time of interpretation on 10/07/2015 at 9:59 pm to Dr. Rolland Porter , who verbally acknowledged these results. Electronically Signed   By: Elgie Collard M.D.   On: 10/07/2015 22:01   I have personally reviewed and evaluated these images and lab results as part of my medical decision-making.   EKG Interpretation None      MDM   Final diagnoses:  Tension pneumothorax    As I enter the room nurses are attempting to get accurate pulse oximetry. This registers 80%. She was changed to 6 L nasal cannula. I've asked her immediate portal chest x-ray which confirms pneumothorax and mediastinal deviation right to left. Her trachea is not deviated she has no JVD but she is agitated and obviously distressed. My diagnosis is tension pneumothorax.  She is DO NOT RESUSCITATE. Her sister is immediately available and I discussed the patient's situation with her. After my description of treatment with thoracostomy tube Sr. states quite clearly that she is certain that the patient would want to undergo this procedure if it were not  "CPR".  Procedural sedation Performed by: Claudean Kinds Consent: Verbal consent obtained. Risks and benefits: risks, benefits and alternatives were discussed Required items: required blood products, implants, devices, and special equipment available Patient identity confirmed: arm band and provided demographic data Time out: Immediately prior to procedure a "time out" was called to verify the correct patient, procedure, equipment, support staff and site/side marked as required.  Sedation type: moderate (conscious) sedation NPO time confirmed and considedered  Sedatives: PROPOFOL  Physician Time at Bedside: 45  Vitals: Vital signs were monitored during sedation. Cardiac Monitor, pulse oximeter Patient tolerance: Patient tolerated the procedure well with no immediate complications. Comments: Pt with uneventful recovered. Returned to pre-procedural sedation baseline  I discussed the case with Dr. Franky Macho of Gen. surgery. He accepts the patient here for treatment for her pneumothorax. He will monitor her for her  chest tube therapy. He recommends admission to the hospitalist. I discussed the case with Dr. Sharl MaLama. ICU bed and sit are recommended. Patient with some continued agitation. Excellent oxygenation. She takecs al care Geodon by mouth at night. Was given Geodon IM and IV Ativan for better sedation. Maintaining oxygenation well.  CRITICAL CARE Performed by: Rolland PorterJAMES, Briah Nary JOSEPH   Total critical care time: 35 minutes  Critical care time was exclusive of separately billable procedures and treating other patients.  Critical care was necessary to treat or prevent imminent or life-threatening deterioration.  Critical care was time spent personally by me on the following activities: development of treatment plan with patient and/or surrogate as well as nursing, discussions with consultants, evaluation of patient's response to treatment, examination of patient, obtaining history from  patient or surrogate, ordering and performing treatments and interventions, ordering and review of laboratory studies, ordering and review of radiographic studies, pulse oximetry and re-evaluation of patient's condition.         Rolland PorterMark Deran Barro, MD 10/08/15 0010

## 2015-10-07 NOTE — ED Notes (Signed)
Dr Fayrene FearingJames at bedside, pt placed on oxygen at 3 lpm via [place] with improvement in pulse ox to 97%, portable chest xray ordered due to no lung sounds on right side,

## 2015-10-07 NOTE — ED Notes (Signed)
[facility] who arrived er to due to altered mental status, staff reported to ems that pt did not seem her normal today and has not been urinating as much and when she did go it was a very strong odor, ems reports that pt is non verbal. cbg [phone]

## 2015-10-08 DIAGNOSIS — R0902 Hypoxemia: Secondary | ICD-10-CM | POA: Diagnosis present

## 2015-10-08 DIAGNOSIS — J939 Pneumothorax, unspecified: Secondary | ICD-10-CM | POA: Diagnosis not present

## 2015-10-08 DIAGNOSIS — G309 Alzheimer's disease, unspecified: Secondary | ICD-10-CM | POA: Diagnosis present

## 2015-10-08 DIAGNOSIS — J930 Spontaneous tension pneumothorax: Principal | ICD-10-CM

## 2015-10-08 DIAGNOSIS — K219 Gastro-esophageal reflux disease without esophagitis: Secondary | ICD-10-CM | POA: Diagnosis present

## 2015-10-08 DIAGNOSIS — E785 Hyperlipidemia, unspecified: Secondary | ICD-10-CM | POA: Diagnosis present

## 2015-10-08 DIAGNOSIS — F0280 Dementia in other diseases classified elsewhere without behavioral disturbance: Secondary | ICD-10-CM | POA: Diagnosis present

## 2015-10-08 DIAGNOSIS — Z7982 Long term (current) use of aspirin: Secondary | ICD-10-CM | POA: Diagnosis not present

## 2015-10-08 DIAGNOSIS — Z66 Do not resuscitate: Secondary | ICD-10-CM | POA: Diagnosis present

## 2015-10-08 DIAGNOSIS — I10 Essential (primary) hypertension: Secondary | ICD-10-CM | POA: Diagnosis present

## 2015-10-08 DIAGNOSIS — F259 Schizoaffective disorder, unspecified: Secondary | ICD-10-CM | POA: Diagnosis present

## 2015-10-08 LAB — CBC
HEMATOCRIT: 35 % — AB (ref 36.0–46.0)
HEMOGLOBIN: 11.6 g/dL — AB (ref 12.0–15.0)
MCH: 32 pg (ref 26.0–34.0)
MCHC: 33.1 g/dL (ref 30.0–36.0)
MCV: 96.7 fL (ref 78.0–100.0)
Platelets: 170 10*3/uL (ref 150–400)
RBC: 3.62 MIL/uL — ABNORMAL LOW (ref 3.87–5.11)
RDW: 13.4 % (ref 11.5–15.5)
WBC: 8.5 10*3/uL (ref 4.0–10.5)

## 2015-10-08 LAB — COMPREHENSIVE METABOLIC PANEL
ALBUMIN: 3.2 g/dL — AB (ref 3.5–5.0)
ALK PHOS: 68 U/L (ref 38–126)
ALT: 19 U/L (ref 14–54)
AST: 24 U/L (ref 15–41)
Anion gap: 6 (ref 5–15)
BILIRUBIN TOTAL: 0.5 mg/dL (ref 0.3–1.2)
BUN: 11 mg/dL (ref 6–20)
CALCIUM: 9.1 mg/dL (ref 8.9–10.3)
CO2: 26 mmol/L (ref 22–32)
CREATININE: 0.42 mg/dL — AB (ref 0.44–1.00)
Chloride: 108 mmol/L (ref 101–111)
GFR calc Af Amer: >60 mL/min (ref >60)
GFR calc non Af Amer: >60 mL/min (ref >60)
GLUCOSE: 99 mg/dL (ref 65–99)
POTASSIUM: 3.9 mmol/L (ref 3.5–5.1)
Sodium: 140 mmol/L (ref 135–145)
Total Protein: 7.2 g/dL (ref 6.5–8.1)

## 2015-10-08 LAB — BASIC METABOLIC PANEL
Anion gap: 7 (ref 5–15)
BUN: 20 mg/dL (ref 6–20)
CALCIUM: 9.4 mg/dL (ref 8.9–10.3)
CO2: 28 mmol/L (ref 22–32)
CREATININE: 0.66 mg/dL (ref 0.44–1.00)
Chloride: 105 mmol/L (ref 101–111)
GFR calc Af Amer: >60 mL/min (ref >60)
GFR calc non Af Amer: >60 mL/min (ref >60)
Glucose, Bld: 106 mg/dL — ABNORMAL HIGH (ref 65–99)
Potassium: 3.7 mmol/L (ref 3.5–5.1)
Sodium: 140 mmol/L (ref 135–145)

## 2015-10-08 LAB — URINALYSIS, ROUTINE W REFLEX MICROSCOPIC
Bilirubin Urine: NEGATIVE
Glucose, UA: NEGATIVE mg/dL
HGB URINE DIPSTICK: NEGATIVE
Ketones, ur: NEGATIVE mg/dL
LEUKOCYTES UA: NEGATIVE
Nitrite: NEGATIVE
Protein, ur: NEGATIVE mg/dL
Specific Gravity, Urine: 1.02 (ref 1.005–1.030)
pH: 5.5 (ref 5.0–8.0)

## 2015-10-08 LAB — PROTIME-INR
INR: 1.12 (ref 0.00–1.49)
Prothrombin Time: 14.6 seconds (ref 11.6–15.2)

## 2015-10-08 LAB — MRSA PCR SCREENING: MRSA BY PCR: NEGATIVE

## 2015-10-08 MED ORDER — MEMANTINE HCL 10 MG PO TABS
5.0000 mg | ORAL_TABLET | Freq: Two times a day (BID) | ORAL | Status: DC
  Administered 2015-10-08 – 2015-10-10 (×4): 5 mg via ORAL
  Filled 2015-10-08 (×4): qty 1

## 2015-10-08 MED ORDER — AMLODIPINE BESYLATE 5 MG PO TABS
5.0000 mg | ORAL_TABLET | Freq: Every day | ORAL | Status: DC
  Administered 2015-10-09 – 2015-10-10 (×2): 5 mg via ORAL
  Filled 2015-10-08 (×2): qty 1

## 2015-10-08 MED ORDER — ACETAMINOPHEN 650 MG RE SUPP: 650.0000 mg | Freq: Four times a day (QID) | RECTAL | Status: DC | PRN

## 2015-10-08 MED ORDER — LORAZEPAM 2 MG/ML IJ SOLN
1.0000 mg | INTRAMUSCULAR | Status: DC | PRN
  Administered 2015-10-08 – 2015-10-10 (×5): 1 mg via INTRAVENOUS
  Filled 2015-10-08 (×5): qty 1

## 2015-10-08 MED ORDER — ACETAMINOPHEN 325 MG PO TABS: 650.0000 mg | ORAL_TABLET | Freq: Four times a day (QID) | ORAL | Status: DC | PRN

## 2015-10-08 MED ORDER — SODIUM CHLORIDE 0.9 % IV SOLN
INTRAVENOUS | Status: DC
  Administered 2015-10-08: 02:00:00 via INTRAVENOUS

## 2015-10-08 MED ORDER — BENZTROPINE MESYLATE 1 MG PO TABS
0.5000 mg | ORAL_TABLET | Freq: Two times a day (BID) | ORAL | Status: DC
  Administered 2015-10-08 – 2015-10-10 (×4): 0.5 mg via ORAL
  Filled 2015-10-08 (×4): qty 1

## 2015-10-08 MED ORDER — CETYLPYRIDINIUM CHLORIDE 0.05 % MT LIQD
7.0000 mL | Freq: Two times a day (BID) | OROMUCOSAL | Status: DC
  Administered 2015-10-08 – 2015-10-10 (×6): 7 mL via OROMUCOSAL

## 2015-10-08 NOTE — Progress Notes (Signed)
Patient briefly seen and examined, discussed with sister at bedside and all questions answered. Admitted with a large right tension PTX that has resolved s/p CT placement. Recheck CXR in am. Surgery following.  Peggye PittEstela Hernandez, MD [facility] Pager: [phone]

## 2015-10-08 NOTE — Progress Notes (Signed)
Attempt to give patient po intake patient will swallow liquids.

## 2015-10-08 NOTE — Clinical Social Work Note (Signed)
Clinical Social Work Assessment  Patient Details  Name: [name] Reynolds MRN: [id number] Date of Birth: [birth year]  Date of referral:  10/08/15               Reason for consult:  Other (Comment Required) [facility]                Permission sought to share information with:    Permission granted to share information::     Name::        Agency::     Relationship::     Contact Information:     Housing/Transportation Living arrangements for the past 2 months:   ([facility] Source of Information:  Facility, Other (Comment Required) (Sister, [name] Reynolds) Patient Interpreter Needed:  None Criminal Activity/Legal Involvement Pertinent to Current Situation/Hospitalization:  No - Comment as needed Significant Relationships:  Siblings Lives with:  Facility Resident Do you feel safe going back to the place where you live?  Yes Need for family participation in patient care:  Yes (Comment)  Care giving concerns:  None identified.   Social Worker assessment / plan: CSW spoke with patient's sister, [name] Reynolds, who was at bedside.  She indicated that [facility]. Ms. Rubye OaksMadden advised that staff assists patient with bathing, grooming her hair and feeding.  She indicated that at baseline staff walks with patient when she ambulates.  She stated that it is her plan for patient to return to the facility at discharge.  CSW spoke with [facility] DandyMary, staff at the facility. She confirmed Ms. Reynolds's statements. She advised that patient can return at discharge.  Employment status:  Disabled (Comment on whether or not currently receiving Disability) Insurance information:  Medicare PT Recommendations:  Not assessed at this time Information / Referral to community resources:     Patient/Family's Response to care:  Family is agreeable for patient to return to facility.   Patient/Family's Understanding of and Emotional  Response to Diagnosis, Current Treatment, and Prognosis: Family understands the diagnosis,  treatment and prognosis.   Emotional Assessment Appearance:  Developmentally appropriate Attitude/Demeanor/Rapport:  Unable to Assess Affect (typically observed):  Unable to Assess Orientation:    Alcohol / Substance use:  Not Applicable Psych involvement (Current and /or in the community):  No (Comment)  Discharge Needs  Concerns to be addressed:  Other (Comment Required (Return to facility at discharge.) Readmission within the last 30 days:  No Current discharge risk:  None Barriers to Discharge:  No Barriers Identified   Annice NeedySettle, Moxie Kalil D, [name] 10/08/2015, 1:56 PM

## 2015-10-08 NOTE — Consult Note (Signed)
Reason for Consult: Tension pneumothorax, right Referring Physician: Dr. Bari Edward Labrum is an 82 y.o. female.  HPI: Patient is an 82 year old black female from a skilled nursing unit who was seen the emergency room for mental status changes and tachypnea. She was found on chest x-ray to have a tension right pneumothorax. A chest tube was placed and now I have been asked to manage the chest tube while patient is in the hospital. The patient is noncommunicative due to her Alzheimer's dementia and schizoaffective disorder.  Past Medical History  Diagnosis Date  . HTN (hypertension)   . Hyperlipemia   . Seizures ([place])   . GERD (gastroesophageal reflux disease)   . Alzheimer's dementia   . Insomnia   . Vitamin D deficiency   . Schizoaffective disorder ([place])   . Pneumonia     recent d/c from [facility] related to this  . History of blood culture     11-13-14 "E. Coli" -hospitalized at [facility]  . Mobility impaired     11-22-14 presently too weak for ambulation  . Speech articulation disorder     pt. not always verbal per [facility] care Staff.    Past Surgical History  Procedure Laterality Date  . Stomach surgery    . Eus N/A 11/29/2014    Procedure: ESOPHAGEAL ENDOSCOPIC ULTRASOUND (EUS) RADIAL;  Surgeon: Arta Silence, MD;  Location: [facility];  Service: Endoscopy;  Laterality: N/A;    No family history on file.  Social History:  reports that she has never smoked. She does not have any smokeless tobacco history on file. She reports that she does not drink alcohol or use illicit drugs.  Allergies: No Known Allergies  Medications:  Prior to Admission:  Prescriptions prior to admission  Medication Sig Dispense Refill Last Dose  . amLODipine (NORVASC) 5 MG tablet Take 1 tablet (5 mg total) by mouth daily. 30 tablet 1 10/07/2015  . aspirin EC 81 MG tablet Take 81 mg by mouth daily.   10/07/2015  . benztropine (COGENTIN) 0.5 MG tablet Take 0.5 mg by mouth 2 (two) times daily.    10/07/2015  . cholecalciferol (VITAMIN D) 1000 UNITS tablet Take 1,000 Units by mouth daily.   10/07/2015  . clonazePAM (KLONOPIN) 0.5 MG tablet Take 0.5 mg by mouth 2 (two) times daily.   10/07/2015  . famotidine (PEPCID) 20 MG tablet Take 20 mg by mouth daily.   10/07/2015  . feeding supplement, ENSURE ENLIVE, (ENSURE ENLIVE) LIQD Take 237 mLs by mouth daily. 237 mL 12 10/07/2015  . hydrOXYzine (VISTARIL) 25 MG capsule Take 25 mg by mouth at bedtime.   10/06/2015  . lactulose, encephalopathy, (GENERLAC) 10 GM/15ML SOLN Take 10 g by mouth 2 (two) times daily.   10/07/2015  . Linaclotide (LINZESS) 145 MCG CAPS capsule Take 1 capsule (145 mcg total) by mouth daily. 30 capsule 1 10/07/2015  . memantine (NAMENDA) 5 MG tablet Take 5 mg by mouth 2 (two) times daily.   10/07/2015  . pantoprazole (PROTONIX) 40 MG tablet Take 1 tablet (40 mg total) by mouth daily. 30 tablet 1 10/07/2015  . simvastatin (ZOCOR) 40 MG tablet Take 40 mg by mouth at bedtime.   10/07/2015  . traZODone (DESYREL) 100 MG tablet Take 100 mg by mouth at bedtime.    10/06/2015  . bisacodyl (DULCOLAX) 10 MG suppository Place 5 mg rectally 2 (two) times daily.   Unknown  . sulfamethoxazole-trimethoprim (BACTRIM DS,SEPTRA DS) 800-160 MG per tablet Take 1 tablet by mouth  2 (two) times daily. Until 6/28 24 tablet 0 11/28/2014 at Unknown time   Scheduled: . amLODipine  5 mg Oral Daily  . antiseptic oral rinse  7 mL Mouth Rinse BID  . benztropine  0.5 mg Oral BID  . memantine  5 mg Oral BID    Results for orders placed or performed during the hospital encounter of 10/07/15 (from the past 48 hour(s))  CBC with Differential/Platelet     Status: None   Collection Time: 10/07/15 10:20 PM  Result Value Ref Range   WBC 8.2 4.0 - 10.5 K/uL   RBC 3.96 3.87 - 5.11 MIL/uL   Hemoglobin 12.6 12.0 - 15.0 g/dL   HCT 38.8 36.0 - 46.0 %   MCV 98.0 78.0 - 100.0 fL   MCH 31.8 26.0 - 34.0 pg   MCHC 32.5 30.0 - 36.0 g/dL   RDW 13.6 11.5 - 15.5 %   Platelets 191 150 -  400 K/uL   Neutrophils Relative % 78 %   Neutro Abs 6.4 1.7 - 7.7 K/uL   Lymphocytes Relative 11 %   Lymphs Abs 0.9 0.7 - 4.0 K/uL   Monocytes Relative 10 %   Monocytes Absolute 0.8 0.1 - 1.0 K/uL   Eosinophils Relative 1 %   Eosinophils Absolute 0.1 0.0 - 0.7 K/uL   Basophils Relative 0 %   Basophils Absolute 0.0 0.0 - 0.1 K/uL  Basic metabolic panel     Status: Abnormal   Collection Time: 10/07/15 10:20 PM  Result Value Ref Range   Sodium 140 135 - 145 mmol/L   Potassium 3.7 3.5 - 5.1 mmol/L   Chloride 105 101 - 111 mmol/L   CO2 28 22 - 32 mmol/L   Glucose, Bld 106 (H) 65 - 99 mg/dL   BUN 20 6 - 20 mg/dL   Creatinine, Ser 0.66 0.44 - 1.00 mg/dL   Calcium 9.4 8.9 - 10.3 mg/dL   GFR calc non Af Amer >60 >60 mL/min   GFR calc Af Amer >60 >60 mL/min    Comment: (NOTE) The eGFR has been calculated using the CKD EPI equation. This calculation has not been validated in all clinical situations. eGFR's persistently <60 mL/min signify possible Chronic Kidney Disease.    Anion gap 7 5 - 15  Protime-INR     Status: None   Collection Time: 10/07/15 10:20 PM  Result Value Ref Range   Prothrombin Time 14.6 11.6 - 15.2 seconds   INR 1.12 0.00 - 1.49  Urinalysis, Routine w reflex microscopic (not at [facility])     Status: None   Collection Time: 10/07/15 11:26 PM  Result Value Ref Range   Color, Urine YELLOW YELLOW   APPearance CLEAR CLEAR   Specific Gravity, Urine 1.020 1.005 - 1.030   pH 5.5 5.0 - 8.0   Glucose, UA NEGATIVE NEGATIVE mg/dL   Hgb urine dipstick NEGATIVE NEGATIVE   Bilirubin Urine NEGATIVE NEGATIVE   Ketones, ur NEGATIVE NEGATIVE mg/dL   Protein, ur NEGATIVE NEGATIVE mg/dL   Nitrite NEGATIVE NEGATIVE   Leukocytes, UA NEGATIVE NEGATIVE    Comment: MICROSCOPIC NOT DONE ON URINES WITH NEGATIVE PROTEIN, BLOOD, LEUKOCYTES, NITRITE, OR GLUCOSE <1000 mg/dL.  MRSA PCR Screening     Status: None   Collection Time: 10/08/15  1:10 AM  Result Value Ref Range   MRSA by PCR  NEGATIVE NEGATIVE    Comment:        The GeneXpert MRSA Assay (FDA approved for NASAL specimens only), is one  component of a comprehensive MRSA colonization surveillance program. It is not intended to diagnose MRSA infection nor to guide or monitor treatment for MRSA infections.   CBC     Status: Abnormal   Collection Time: 10/08/15  6:09 AM  Result Value Ref Range   WBC 8.5 4.0 - 10.5 K/uL   RBC 3.62 (L) 3.87 - 5.11 MIL/uL   Hemoglobin 11.6 (L) 12.0 - 15.0 g/dL   HCT 35.0 (L) 36.0 - 46.0 %   MCV 96.7 78.0 - 100.0 fL   MCH 32.0 26.0 - 34.0 pg   MCHC 33.1 30.0 - 36.0 g/dL   RDW 13.4 11.5 - 15.5 %   Platelets 170 150 - 400 K/uL  Comprehensive metabolic panel     Status: Abnormal   Collection Time: 10/08/15  6:09 AM  Result Value Ref Range   Sodium 140 135 - 145 mmol/L   Potassium 3.9 3.5 - 5.1 mmol/L   Chloride 108 101 - 111 mmol/L   CO2 26 22 - 32 mmol/L   Glucose, Bld 99 65 - 99 mg/dL   BUN 11 6 - 20 mg/dL   Creatinine, Ser 0.42 (L) 0.44 - 1.00 mg/dL   Calcium 9.1 8.9 - 10.3 mg/dL   Total Protein 7.2 6.5 - 8.1 g/dL   Albumin 3.2 (L) 3.5 - 5.0 g/dL   AST 24 15 - 41 U/L   ALT 19 14 - 54 U/L   Alkaline Phosphatase 68 38 - 126 U/L   Total Bilirubin 0.5 0.3 - 1.2 mg/dL   GFR calc non Af Amer >60 >60 mL/min   GFR calc Af Amer >60 >60 mL/min    Comment: (NOTE) The eGFR has been calculated using the CKD EPI equation. This calculation has not been validated in all clinical situations. eGFR's persistently <60 mL/min signify possible Chronic Kidney Disease.    Anion gap 6 5 - 15    Dg Chest Portable 1 View  10/07/2015  CLINICAL DATA:  82 year old female with right-sided pneumothorax status post right sided chest tube placement. EXAM: PORTABLE CHEST 1 VIEW COMPARISON:  Chest radiograph dated 10/07/2015. FINDINGS: There has been interval placement of a right-sided chest tube the tip in the right apical region. There has been interval re-expansion of the right lung. No definite  residual pneumothorax identified on this study. Patchy and linear areas of pulmonary density predominantly in the right upper lobe may represent atelectatic changes. The left lung is clear. Top-normal cardiac size. Right chest wall soft tissue emphysema noted. No acute osseous pathology. IMPRESSION: Interval placement of a right-sided chest tube with re-expansion of the right lung. No definite residual pneumothorax identified. Electronically Signed   By: Anner Crete M.D.   On: 10/07/2015 23:04   Dg Chest Portable 1 View  10/07/2015  CLINICAL DATA:  82 year old female with shortness of breath and altered mental status. EXAM: PORTABLE CHEST 1 VIEW COMPARISON:  Chest radiograph dated 11/13/2014 FINDINGS: There is a large right-sided pneumothorax with complete collapse of the right lung. There is apparent shift of the mediastinum to the left hemithorax which may be partially related to patient rotation. A degree of tension pneumothorax is not excluded. Clinical correlation is recommended. There is increased interstitial markings throughout the left lung. There is no pneumothorax on the left. There is mild cardiomegaly. No acute osseous pathology identified. IMPRESSION: Large right-sided pneumothorax with complete collapse of the right lung and mild shift of the mediastinum into the left hemithorax. Critical Value/emergent results were called by telephone  at the time of interpretation on 10/07/2015 at 9:59 pm to Dr. Tanna Furry , who verbally acknowledged these results. Electronically Signed   By: Anner Crete M.D.   On: 10/07/2015 22:01    ROS:  Review of systems not obtained due to patient factors.  Blood pressure 135/76, pulse 103, temperature 97.3 F (36.3 C), temperature source Oral, resp. rate 14, height 5' 7"  (1.702 m), weight 50.8 kg (111 lb 15.9 oz), SpO2 99 %. Physical Exam: Partially restrained black female in no acute distress. HEENT examination unremarkable. Neck is supple without  lymphadenopathy.  Heart examination reveals a regular rate and rhythm without S3, S4, murmurs Lungs: Auscultation with equal breath sounds bilaterally. A chest tube is in place in the right midaxillary line. No air leak is noted. Minimal pleural effusion is noted. Chest x-ray from yesterday evening reviewed. Assessment/Plan: Impression: Status post chest tube placement for right tension pneumothorax, stable. No air leak noted. Plan: Continue current management. Will review chest x-ray in a.m.  Yousaf Sainato A 10/08/2015, 11:09 AM

## 2015-10-08 NOTE — Progress Notes (Signed)
[facility] H&P   Patient Demographics:    [name] Reynolds, is a 82 y.o. female  MRN: [id number]   DOB - 25-Aug-1928  Admit Date - 10/07/2015  Outpatient Primary MD for the patient is [facility]  Referring MD/NP/PA: Dr. Fayrene Fearing  Patient coming from:   Chief Complaint  Patient presents with  . Altered Mental Status      HPI:    [name] Reynolds  is a 82 y.o. female, from family care center was brought to the hospital as they noticed that her "activity was different". In the ED patient was found to be hypoxic, O2 sat 80%. Portal chest x-ray in the ED conformed pneumothorax with mediastinal deviation right-to-left. Chest tube was inserted by the ED physician. Dr. Lovell Sheehan was called and he recommended patient to be admitted to the ICU and he will follow the patient in a.m.  Patient is sedated at this time was unable to provide any history.    Review of systems:    Unobtainable at this time     With Past History of the following :    Past Medical History  Diagnosis Date  . HTN (hypertension)   . Hyperlipemia   . Seizures ([facility])   . GERD (gastroesophageal reflux disease)   . Alzheimer's dementia   . Insomnia   . Vitamin D deficiency   . Schizoaffective disorder ([facility])   . Pneumonia     recent d/c from [facility] related to this  . History of blood culture     11-13-14 "E. Coli" -hospitalized at [facility]  . Mobility impaired     11-22-14 presently too weak for ambulation  . Speech articulation disorder     pt. not always verbal per [facility] care Staff.      Past Surgical History  Procedure Laterality Date  . Stomach surgery    . Eus N/A 11/29/2014    Procedure: ESOPHAGEAL ENDOSCOPIC ULTRASOUND (EUS) RADIAL;  Surgeon: Willis Modena, MD;  Location: [facility];  Service: Endoscopy;  Laterality: N/A;      Social History:     Social History  Substance Use Topics  . Smoking  status: Never Smoker   . Smokeless tobacco: Not on file  . Alcohol Use: No     Lives - [facility]     Family History :   Family history unobtainable at this time    Home Medications:   Prior to Admission medications   Medication Sig Start Date End Date Taking? Authorizing Provider  amLODipine (NORVASC) 5 MG tablet Take 1 tablet (5 mg total) by mouth daily. 10/13/14  Yes Jeralyn Bennett, MD  aspirin EC 81 MG tablet Take 81 mg by mouth daily.   Yes Historical Provider, MD  benztropine (COGENTIN) 0.5 MG tablet Take 0.5 mg by mouth 2 (two) times daily.   Yes Historical Provider, MD  cholecalciferol (VITAMIN D) 1000 UNITS tablet Take 1,000 Units by mouth daily.   Yes Historical Provider, MD  clonazePAM (KLONOPIN) 0.5 MG  tablet Take 0.5 mg by mouth 2 (two) times daily.   Yes Historical Provider, MD  famotidine (PEPCID) 20 MG tablet Take 20 mg by mouth daily.   Yes Historical Provider, MD  feeding supplement, ENSURE ENLIVE, (ENSURE ENLIVE) LIQD Take 237 mLs by mouth daily. 10/13/14  Yes Jeralyn Bennett, MD  hydrOXYzine (VISTARIL) 25 MG capsule Take 25 mg by mouth at bedtime.   Yes Historical Provider, MD  lactulose, encephalopathy, (GENERLAC) 10 GM/15ML SOLN Take 10 g by mouth 2 (two) times daily.   Yes Historical Provider, MD  Linaclotide Karlene Einstein) 145 MCG CAPS capsule Take 1 capsule (145 mcg total) by mouth daily. 11/17/14  Yes Erick Blinks, MD  memantine (NAMENDA) 5 MG tablet Take 5 mg by mouth 2 (two) times daily.   Yes Historical Provider, MD  pantoprazole (PROTONIX) 40 MG tablet Take 1 tablet (40 mg total) by mouth daily. 11/17/14  Yes Erick Blinks, MD  simvastatin (ZOCOR) 40 MG tablet Take 40 mg by mouth at bedtime.   Yes Historical Provider, MD  traZODone (DESYREL) 100 MG tablet Take 100 mg by mouth at bedtime.    Yes Historical Provider, MD  bisacodyl (DULCOLAX) 10 MG suppository Place 5 mg rectally 2 (two) times daily.    Historical Provider, MD    sulfamethoxazole-trimethoprim (BACTRIM DS,SEPTRA DS) 800-160 MG per tablet Take 1 tablet by mouth 2 (two) times daily. Until 6/28 11/17/14   Erick Blinks, MD     Allergies:    No Known Allergies   Physical Exam:   Vitals  Blood pressure 123/76, pulse 101, temperature 97.9 F (36.6 C), temperature source Rectal, resp. rate 12, weight 48.988 kg (108 lb), SpO2 100 %.   1. General Frail looking elderly woman heavily sedated     2 Supple Neck, No JVD, No cervical lymphadenopathy appriciated, No Carotid Bruits.  3. Symmetrical Chest wall movement, Good air movement bilaterally, CTAB.  4 RRR, No Gallops, Rubs or Murmurs, No Parasternal Heave.  5 Positive Bowel Sounds, Abdomen Soft, No tenderness, No organomegaly appriciated,No rebound -guarding or rigidity.  6  No Cyanosis, Normal Skin Turgor, No Skin Rash or Bruise.       Data Review:    CBC  Recent Labs Lab 10/07/15 2220  WBC 8.2  HGB 12.6  HCT 38.8  PLT 191  MCV 98.0  MCH 31.8  MCHC 32.5  RDW 13.6  LYMPHSABS 0.9  MONOABS 0.8  EOSABS 0.1  BASOSABS 0.0   ------------------------------------------------------------------------------------------------------------------  Chemistries   Recent Labs Lab 10/07/15 2220  NA 140  K 3.7  CL 105  CO2 28  GLUCOSE 106*  BUN 20  CREATININE 0.66  CALCIUM 9.4    Coagulation profile  Recent Labs Lab 10/07/15 2220  INR 1.12   ------------------------------------------------------------------------------------------------------------------- No results for input(s): DDIMER in the last 72 hours. -------------------------------------------------------------------------------------------------------------------  Cardiac Enzymes   ---------------------------------------------------------------------------------------------------------------  Urinalysis    Component Value Date/Time   COLORURINE YELLOW 10/07/2015 2326   APPEARANCEUR CLEAR 10/07/2015 2326    LABSPEC 1.020 10/07/2015 2326   PHURINE 5.5 10/07/2015 2326   GLUCOSEU NEGATIVE 10/07/2015 2326   HGBUR NEGATIVE 10/07/2015 2326   BILIRUBINUR NEGATIVE 10/07/2015 2326   KETONESUR NEGATIVE 10/07/2015 2326   PROTEINUR NEGATIVE 10/07/2015 2326   UROBILINOGEN 4.0* 11/13/2014 1919   NITRITE NEGATIVE 10/07/2015 2326   LEUKOCYTESUR NEGATIVE 10/07/2015 2326    ----------------------------------------------------------------------------------------------------------------   Imaging Results:    Dg Chest Portable 1 View  10/07/2015  CLINICAL DATA:  82 year old female with right-sided pneumothorax status post  right sided chest tube placement. EXAM: PORTABLE CHEST 1 VIEW COMPARISON:  Chest radiograph dated 10/07/2015. FINDINGS: There has been interval placement of a right-sided chest tube the tip in the right apical region. There has been interval re-expansion of the right lung. No definite residual pneumothorax identified on this study. Patchy and linear areas of pulmonary density predominantly in the right upper lobe may represent atelectatic changes. The left lung is clear. Top-normal cardiac size. Right chest wall soft tissue emphysema noted. No acute osseous pathology. IMPRESSION: Interval placement of a right-sided chest tube with re-expansion of the right lung. No definite residual pneumothorax identified. Electronically Signed   By: Elgie CollardArash  Radparvar M.D.   On: 10/07/2015 23:04   Dg Chest Portable 1 View  10/07/2015  CLINICAL DATA:  82 year old female with shortness of breath and altered mental status. EXAM: PORTABLE CHEST 1 VIEW COMPARISON:  Chest radiograph dated 11/13/2014 FINDINGS: There is a large right-sided pneumothorax with complete collapse of the right lung. There is apparent shift of the mediastinum to the left hemithorax which may be partially related to patient rotation. A degree of tension pneumothorax is not excluded. Clinical correlation is recommended. There is increased interstitial  markings throughout the left lung. There is no pneumothorax on the left. There is mild cardiomegaly. No acute osseous pathology identified. IMPRESSION: Large right-sided pneumothorax with complete collapse of the right lung and mild shift of the mediastinum into the left hemithorax. Critical Value/emergent results were called by telephone at the time of interpretation on 10/07/2015 at 9:59 pm to Dr. Rolland PorterMARK JAMES , who verbally acknowledged these results. Electronically Signed   By: Elgie CollardArash  Radparvar M.D.   On: 10/07/2015 22:01    My personal review of EKG: Sinus tachycardia, QTc 457 , no Acute ST changes   Assessment & Plan:    Active Problems:   Tension pneumothorax   Pneumothorax      1. Tension pneumothorax-status post chest tube placement in the ED. We will admit the patient ICU. General surgery to follow in a.m. 2. Dementia- stable, continue Namenda.   DVT Prophylaxis-    SCDs   AM Labs Ordered, also please review Full Orders  Family Communication: Admission, patients condition and plan of care including tests being ordered have been discussed with the patient's daughter on phone* who indicate understanding and agree with the plan and Code Status.  Code Status:  DNR  Admission status: Inpatient  Time spent in minutes : 55 min   LAMA,GAGAN S M.D on 10/08/2015 at 1:04 AM  Between 7am to 7pm - Pager - [phone]. After 7pm go to [email or web address] - password [email or web address]  [facility] - Office  [phone]

## 2015-10-08 NOTE — Progress Notes (Signed)
Patient transferred to room 314 via bed. Chest tube intact.

## 2015-10-08 NOTE — Care Management Note (Signed)
Case Management Note  Patient Details  Name: Malachy Chamberrene Asencio MRN: [id number] Date of Birth: Feb 07, 1929  Subjective/Objective:       [facility]orax[facility]. Anticipate return at discharge, may need HH vs. Higher level of care.              Action/Plan: Will continue to follow.   Expected Discharge Date:       10/10/15           Expected Discharge Plan:  Assisted Living / Rest Home  In-House Referral:  Clinical Social Work  Discharge planning Services  CM Consult  Post Acute Care Choice:    Choice offered to:     DME Arranged:    DME Agency:     HH Arranged:    HH Agency:     Status of Service:  In process, will continue to follow  Medicare Important Message Given:    Date Medicare IM Given:    Medicare IM give by:    Date Additional Medicare IM Given:    Additional Medicare Important Message give by:     If discussed at Long Length of Stay Meetings, dates discussed:    Additional Comments:  Aimee Timmons, Chrystine OilerSharley Diane, RN 10/08/2015, 1:30 PM

## 2015-10-09 ENCOUNTER — Inpatient Hospital Stay (HOSPITAL_COMMUNITY): Payer: MEDICARE

## 2015-10-09 NOTE — Progress Notes (Signed)
Subjective: Resting comfortably.  Objective: Vital signs in last 24 hours: Temp:  [97.4 F (36.3 C)-98 F (36.7 C)] 98 F (36.7 C) (05/09 0442) Pulse Rate:  [99-108] 108 (05/09 0442) Resp:  [12-36] 20 (05/09 0442) BP: (115-159)/(68-97) 127/92 mmHg (05/09 0442) SpO2:  [91 %-100 %] 97 % (05/09 0442) Last BM Date:  (known)  Intake/Output from previous day: 05/08 0701 - 05/09 0700 In: -  Out: 750 [Urine:750] Intake/Output this shift:    General appearance: appears stated age and no distress Resp: clear to auscultation bilaterally and Right chest tube without air leak.  Lab Results:   Recent Labs  10/07/15 2220 10/08/15 0609  WBC 8.2 8.5  HGB 12.6 11.6*  HCT 38.8 35.0*  PLT 191 170   BMET  Recent Labs  10/07/15 2220 10/08/15 0609  NA 140 140  K 3.7 3.9  CL 105 108  CO2 28 26  GLUCOSE 106* 99  BUN 20 11  CREATININE 0.66 0.42*  CALCIUM 9.4 9.1   PT/INR  Recent Labs  10/07/15 2220  LABPROT 14.6  INR 1.12    Studies/Results: Dg Chest Port 1 View  10/09/2015  CLINICAL DATA:  Follow-up right pneumothorax EXAM: PORTABLE CHEST 1 VIEW COMPARISON:  Two days ago FINDINGS: Right apical chest tube in unchanged position. No visible pneumothorax. Right chest wall gas is extensive but without convincing increase. Volume loss and asymmetric density on the right that is presumably from atelectasis. The left lung is comparatively clear. Normal heart size and aortic contours. IMPRESSION: No visible pneumothorax.  Right chest tube in similar position. Electronically Signed   By: Marnee SpringJonathon  Watts M.D.   On: 10/09/2015 08:03   Dg Chest Portable 1 View  10/07/2015  CLINICAL DATA:  82 year old female with right-sided pneumothorax status post right sided chest tube placement. EXAM: PORTABLE CHEST 1 VIEW COMPARISON:  Chest radiograph dated 10/07/2015. FINDINGS: There has been interval placement of a right-sided chest tube the tip in the right apical region. There has been interval  re-expansion of the right lung. No definite residual pneumothorax identified on this study. Patchy and linear areas of pulmonary density predominantly in the right upper lobe may represent atelectatic changes. The left lung is clear. Top-normal cardiac size. Right chest wall soft tissue emphysema noted. No acute osseous pathology. IMPRESSION: Interval placement of a right-sided chest tube with re-expansion of the right lung. No definite residual pneumothorax identified. Electronically Signed   By: Elgie CollardArash  Radparvar M.D.   On: 10/07/2015 23:04   Dg Chest Portable 1 View  10/07/2015  CLINICAL DATA:  82 year old female with shortness of breath and altered mental status. EXAM: PORTABLE CHEST 1 VIEW COMPARISON:  Chest radiograph dated 11/13/2014 FINDINGS: There is a large right-sided pneumothorax with complete collapse of the right lung. There is apparent shift of the mediastinum to the left hemithorax which may be partially related to patient rotation. A degree of tension pneumothorax is not excluded. Clinical correlation is recommended. There is increased interstitial markings throughout the left lung. There is no pneumothorax on the left. There is mild cardiomegaly. No acute osseous pathology identified. IMPRESSION: Large right-sided pneumothorax with complete collapse of the right lung and mild shift of the mediastinum into the left hemithorax. Critical Value/emergent results were called by telephone at the time of interpretation on 10/07/2015 at 9:59 pm to Dr. Rolland PorterMARK JAMES , who verbally acknowledged these results. Electronically Signed   By: Elgie CollardArash  Radparvar M.D.   On: 10/07/2015 22:01    Anti-infectives: Anti-infectives  None      Assessment/Plan: Impression: Status post right chest tube placement for tension pneumothorax. Chest x-ray today looks good. No air leak is present. Will place to waterseal. Will reassess in a.m. Should that chest x-ray looked okay, chest tube will be removed tomorrow.  LOS: 1  day    [name] Reynolds A 10/09/2015

## 2015-10-09 NOTE — Care Management Important Message (Signed)
Important Message  Patient Details  Name: [name] Reynolds MRN: [id number] Date of Birth: Mar 13, 1929   Medicare Important Message Given:  Yes    Adonis HugueninBerkhead, Keyla Milone L, RN 10/09/2015, 12:59 PM

## 2015-10-09 NOTE — Progress Notes (Signed)
PROGRESS NOTE    [name] Reynolds  [id number] DOB: Oct 13, 1928 DOA: 10/07/2015 [facility]     Brief Narrative:  82 year old woman admitted to the hospital on 5/7 with complaints of shortness of breath and altered mental status. She was found to have a large spontaneous right-sided pneumothorax with complete collapse of her right lung. Chest tube was inserted in the emergency department with full reexpansion. Dr. Lovell SheehanJenkins has been following for chest tube management.   Assessment & Plan:   Active Problems:   Tension pneumothorax   Pneumothorax   Pneumothorax on right   Right-sided tension pneumothorax -Chest tube has been water sealed today. -Follow-up repeat chest x-ray in the morning, if all looks well Dr. Lovell SheehanJenkins plan is to remove chest tube  Rest of chronic conditions have been stable   DVT prophylaxis: SCDs Code Status: DO NOT RESUSCITATE Family Communication: Sister at bedside updated on plan of care and all questions answered Disposition Plan: Back to group home, likely 24-48 hours  Consultants:   Surgery, Dr. Lovell SheehanJenkins  Procedures:   Right chest tube insertion  Antimicrobials:   None    Subjective: Very demented, unable to verbalize complaints  Objective: Filed Vitals:   10/08/15 1200 10/08/15 2236 10/09/15 0442 10/09/15 1500  BP: 122/97 159/79 127/92 106/56  Pulse: 106 99 108 120  Temp: 97.5 F (36.4 C) 97.4 F (36.3 C) 98 F (36.7 C) 98.7 F (37.1 C)  TempSrc: Axillary Axillary Oral Oral  Resp: 36 20 20 20   Height:      Weight:      SpO2: 91% 99% 97% 97%    Intake/Output Summary (Last 24 hours) at 10/09/15 1521 Last data filed at 10/09/15 1300  Gross per 24 hour  Intake    240 ml  Output      0 ml  Net    240 ml   Filed Weights   10/08/15 0115 10/08/15 0121 10/08/15 0500  Weight: 50.8 kg (111 lb 15.9 oz) 50.8 kg (111 lb 15.9 oz) 50.8 kg (111 lb 15.9 oz)    Examination:   General exam: Eyes open,  nonverbal Respiratory system: Clear to auscultation. Respiratory effort normal. Cardiovascular system:RRR. No murmurs, rubs, gallops. Gastrointestinal system: Abdomen is nondistended, soft and nontender. No organomegaly or masses felt. Normal bowel sounds heard. Central nervous system: Unable to fully assess given current mental state Extremities: No C/C/E, +pedal pulses Skin: No rashes, lesions or ulcers Psychiatry: Unable to fully assess given current mental state    Data Reviewed: I have personally reviewed following labs and imaging studies  CBC:  Recent Labs Lab 10/07/15 2220 10/08/15 0609  WBC 8.2 8.5  NEUTROABS 6.4  --   HGB 12.6 11.6*  HCT 38.8 35.0*  MCV 98.0 96.7  PLT 191 170   Basic Metabolic Panel:  Recent Labs Lab 10/07/15 2220 10/08/15 0609  NA 140 140  K 3.7 3.9  CL 105 108  CO2 28 26  GLUCOSE 106* 99  BUN 20 11  CREATININE 0.66 0.42*  CALCIUM 9.4 9.1   GFR: Estimated Creatinine Clearance: 41.2 mL/min (by C-G formula based on Cr of 0.42). Liver Function Tests:  Recent Labs Lab 10/08/15 0609  AST 24  ALT 19  ALKPHOS 68  BILITOT 0.5  PROT 7.2  ALBUMIN 3.2*   No results for input(s): LIPASE, AMYLASE in the last 168 hours. No results for input(s): AMMONIA in the last 168 hours. Coagulation Profile:  Recent Labs Lab 10/07/15 2220  INR 1.12   Cardiac Enzymes: No results for input(s): CKTOTAL, CKMB, CKMBINDEX, TROPONINI in the last 168 hours. BNP (last 3 results) No results for input(s): PROBNP in the last 8760 hours. HbA1C: No results for input(s): HGBA1C in the last 72 hours. CBG: No results for input(s): GLUCAP in the last 168 hours. Lipid Profile: No results for input(s): CHOL, HDL, LDLCALC, TRIG, CHOLHDL, LDLDIRECT in the last 72 hours. Thyroid Function Tests: No results for input(s): TSH, T4TOTAL, FREET4, T3FREE, THYROIDAB in the last 72 hours. Anemia Panel: No results for input(s): VITAMINB12, FOLATE, FERRITIN, TIBC, IRON,  RETICCTPCT in the last 72 hours. Urine analysis:    Component Value Date/Time   COLORURINE YELLOW 10/07/2015 2326   APPEARANCEUR CLEAR 10/07/2015 2326   LABSPEC 1.020 10/07/2015 2326   PHURINE 5.5 10/07/2015 2326   GLUCOSEU NEGATIVE 10/07/2015 2326   HGBUR NEGATIVE 10/07/2015 2326   BILIRUBINUR NEGATIVE 10/07/2015 2326   KETONESUR NEGATIVE 10/07/2015 2326   PROTEINUR NEGATIVE 10/07/2015 2326   UROBILINOGEN 4.0* 11/13/2014 1919   NITRITE NEGATIVE 10/07/2015 2326   LEUKOCYTESUR NEGATIVE 10/07/2015 2326   Sepsis Labs: @LABRCNTIP (procalcitonin:4,lacticidven:4)  ) Recent Results (from the past 240 hour(s))  Urine culture     Status: None (Preliminary result)   Collection Time: 10/07/15 11:26 PM  Result Value Ref Range Status   Specimen Description URINE, CLEAN CATCH  Final   Special Requests NONE  Final   Culture   Final    NO GROWTH < 24 HOURS Performed at [facility]    Report Status PENDING  Incomplete  MRSA PCR Screening     Status: None   Collection Time: 10/08/15  1:10 AM  Result Value Ref Range Status   MRSA by PCR NEGATIVE NEGATIVE Final    Comment:        The GeneXpert MRSA Assay (FDA approved for NASAL specimens only), is one component of a comprehensive MRSA colonization surveillance program. It is not intended to diagnose MRSA infection nor to guide or monitor treatment for MRSA infections.          Radiology Studies: Dg Chest Port 1 View  10/09/2015  CLINICAL DATA:  Follow-up right pneumothorax EXAM: PORTABLE CHEST 1 VIEW COMPARISON:  Two days ago FINDINGS: Right apical chest tube in unchanged position. No visible pneumothorax. Right chest wall gas is extensive but without convincing increase. Volume loss and asymmetric density on the right that is presumably from atelectasis. The left lung is comparatively clear. Normal heart size and aortic contours. IMPRESSION: No visible pneumothorax.  Right chest tube in similar position. Electronically  Signed   By: Marnee Spring M.D.   On: 10/09/2015 08:03   Dg Chest Portable 1 View  10/07/2015  CLINICAL DATA:  82 year old female with right-sided pneumothorax status post right sided chest tube placement. EXAM: PORTABLE CHEST 1 VIEW COMPARISON:  Chest radiograph dated 10/07/2015. FINDINGS: There has been interval placement of a right-sided chest tube the tip in the right apical region. There has been interval re-expansion of the right lung. No definite residual pneumothorax identified on this study. Patchy and linear areas of pulmonary density predominantly in the right upper lobe may represent atelectatic changes. The left lung is clear. Top-normal cardiac size. Right chest wall soft tissue emphysema noted. No acute osseous pathology. IMPRESSION: Interval placement of a right-sided chest tube with re-expansion of the right lung. No definite residual pneumothorax identified. Electronically Signed   By: Elgie Collard M.D.   On: 10/07/2015 23:04   Dg Chest Portable  1 View  10/07/2015  CLINICAL DATA:  82 year old female with shortness of breath and altered mental status. EXAM: PORTABLE CHEST 1 VIEW COMPARISON:  Chest radiograph dated 11/13/2014 FINDINGS: There is a large right-sided pneumothorax with complete collapse of the right lung. There is apparent shift of the mediastinum to the left hemithorax which may be partially related to patient rotation. A degree of tension pneumothorax is not excluded. Clinical correlation is recommended. There is increased interstitial markings throughout the left lung. There is no pneumothorax on the left. There is mild cardiomegaly. No acute osseous pathology identified. IMPRESSION: Large right-sided pneumothorax with complete collapse of the right lung and mild shift of the mediastinum into the left hemithorax. Critical Value/emergent results were called by telephone at the time of interpretation on 10/07/2015 at 9:59 pm to Dr. Rolland Porter , who verbally acknowledged these  results. Electronically Signed   By: Elgie Collard M.D.   On: 10/07/2015 22:01        Scheduled Meds: . amLODipine  5 mg Oral Daily  . antiseptic oral rinse  7 mL Mouth Rinse BID  . benztropine  0.5 mg Oral BID  . memantine  5 mg Oral BID   Continuous Infusions: . sodium chloride 10 mL/hr at 10/08/15 0600     LOS: 1 day    Time spent: 15 minutes. Greater than 50% of this time was spent in direct contact with the patient coordinating care.     Chaya Jan, MD [facility] Pager [phone]  If 7PM-7AM, please contact night-coverage [email or web address] Password [email or web address] 10/09/2015, 3:21 PM

## 2015-10-09 NOTE — Care Management Note (Signed)
Case Management Note  Patient Details  Name: [name] Reynolds MRN: [id number] Date of Birth: Dec 08, 1928  Subjective/Objective:Spoke with sister [name] who stated is patient closest relative and stated that she makes decisions for patient. Patient unable to communicate. Sister stated that her and her husband were considering taking the patient home instead of [facility]. She asked about how to go about changing this.  Asked to speak with CSW.   I told MS Rubye OaksMadden that this is something that was possible but that she would have to contact patients insurance and DSS to answer her questions.                     Action/Plan: More to follow. [facility]. Patient may go home with sister and husband.   Expected Discharge Date:                  Expected Discharge Plan:  Assisted Living / Rest Home  In-House Referral:  Clinical Social Work  Discharge planning Services  CM Consult  Post Acute Care Choice:    Choice offered to:     DME Arranged:    DME Agency:     HH Arranged:    HH Agency:     Status of Service:  In process, will continue to follow  Medicare Important Message Given:  Yes Date Medicare IM Given:    Medicare IM give by:    Date Additional Medicare IM Given:    Additional Medicare Important Message give by:     If discussed at Long Length of Stay Meetings, dates discussed:    Additional Comments:  Adonis HugueninBerkhead, Abdulloh Ullom L, RN 10/09/2015, 1:24 PM

## 2015-10-10 ENCOUNTER — Inpatient Hospital Stay (HOSPITAL_COMMUNITY): Payer: MEDICARE

## 2015-10-10 LAB — URINE CULTURE: Culture: NO GROWTH

## 2015-10-10 MED ORDER — ACETAMINOPHEN 325 MG PO TABS: 650.0000 mg | ORAL_TABLET | Freq: Four times a day (QID) | ORAL | Status: AC | PRN

## 2015-10-10 NOTE — Discharge Summary (Signed)
Physician Discharge Summary  [name] Reynolds XBJ:[id number] DOB: May 10, 1929 DOA: 10/07/2015  PCP: [facility]  Admit date: 10/07/2015 Discharge date: 10/10/2015  Time spent: 35 minutes  Recommendations for Outpatient Follow-up:  1. Follow up with PCP at the SNF as scheduled.    Discharge Diagnoses:  Active Problems:   Tension pneumothorax   Pneumothorax   Pneumothorax on right   Discharge Condition: Stable.  No SOB, and no pneumothorax after chest tube d/c'ed.   Diet recommendation: as tolerated.   Filed Weights   10/08/15 0115 10/08/15 0121 10/08/15 0500  Weight: 50.8 kg (111 lb 15.9 oz) 50.8 kg (111 lb 15.9 oz) 50.8 kg (111 lb 15.9 oz)    History of present illness: Patient was admitted by Dr Sharl Ma on Oct 08, 2015 for a large tension pneumothorax.  As per his H and P:  "  [name] Reynolds is a 82 y.o. female, from family care center was brought to the hospital as they noticed that her "activity was different". In the ED patient was found to be hypoxic, O2 sat 80%. Portal chest x-ray in the ED conformed pneumothorax with mediastinal deviation right-to-left. Chest tube was inserted by the ED physician. Dr. Lovell Sheehan was called and he recommended patient to be admitted to the ICU and he will follow the patient in a.m.  Patient is sedated at this time was unable to provide any history.   Hospital Course: patient is nonverbal and was not able to voice any complaints.   She was seen in consultation with Dr Lovell Sheehan, and he magaged her chest tube.  She did not have any leak on water seal, and her pneumothorax resolved.  Today, the chest tube was removed, and a follow up portable CXR did not show any pneumothorax.  He recommended that she be discharged back to the SNF, and she doesn't appear to be having any SOB or pain.  Her chest tube site is to be covered for three days.  If she has any respiratory problem, a CXR should be done promptly.  She will follow up with her PCP as  previously scheduled.  Updated her sister at her bedside, and her medications were resumed.  Thank you and Good Day.   Procedures:  Chest tube placement.   Consultations:  Surgery, Dr Lovell Sheehan.   Discharge Exam: Filed Vitals:   10/10/15 0818 10/10/15 1254  BP: 140/69 127/57  Pulse: 109 65  Temp: 97.5 F (36.4 C) 98.4 F (36.9 C)  Resp: 20 20    Discharge Instructions   Discharge Instructions    Diet - low sodium heart healthy    Complete by:  As directed      Discharge instructions    Complete by:  As directed   She will need a CXR if she develops shortness of breath.   The tape over the chest tube site is to be left covered for three days.     Increase activity slowly    Complete by:  As directed           Current Discharge Medication List    START taking these medications   Details  acetaminophen (TYLENOL) 325 MG tablet Take 2 tablets (650 mg total) by mouth every 6 (six) hours as needed for mild pain (or Fever >/= 101). Qty: 30 tablet, Refills: 1      CONTINUE these medications which have NOT CHANGED   Details  amLODipine (NORVASC) 5 MG tablet Take 1 tablet (5 mg total)  by mouth daily. Qty: 30 tablet, Refills: 1    aspirin EC 81 MG tablet Take 81 mg by mouth daily.    benztropine (COGENTIN) 0.5 MG tablet Take 0.5 mg by mouth 2 (two) times daily.    cholecalciferol (VITAMIN D) 1000 UNITS tablet Take 1,000 Units by mouth daily.    clonazePAM (KLONOPIN) 0.5 MG tablet Take 0.5 mg by mouth 2 (two) times daily.    famotidine (PEPCID) 20 MG tablet Take 20 mg by mouth daily.    feeding supplement, ENSURE ENLIVE, (ENSURE ENLIVE) LIQD Take 237 mLs by mouth daily. Qty: 237 mL, Refills: 12    hydrOXYzine (VISTARIL) 25 MG capsule Take 25 mg by mouth at bedtime.    lactulose, encephalopathy, (GENERLAC) 10 GM/15ML SOLN Take 10 g by mouth 2 (two) times daily.    Linaclotide (LINZESS) 145 MCG CAPS capsule Take 1 capsule (145 mcg total) by mouth daily. Qty: 30  capsule, Refills: 1    memantine (NAMENDA) 5 MG tablet Take 5 mg by mouth 2 (two) times daily.    pantoprazole (PROTONIX) 40 MG tablet Take 1 tablet (40 mg total) by mouth daily. Qty: 30 tablet, Refills: 1    simvastatin (ZOCOR) 40 MG tablet Take 40 mg by mouth at bedtime.    traZODone (DESYREL) 100 MG tablet Take 100 mg by mouth at bedtime.     bisacodyl (DULCOLAX) 10 MG suppository Place 5 mg rectally 2 (two) times daily.      STOP taking these medications     sulfamethoxazole-trimethoprim (BACTRIM DS,SEPTRA DS) 800-160 MG per tablet        No Known Allergies    The results of significant diagnostics from this hospitalization (including imaging, microbiology, ancillary and laboratory) are listed below for reference.    Significant Diagnostic Studies: Dg Chest Port 1 View  10/10/2015  CLINICAL DATA:  82 year old female status post chest tube removal. Initial encounter. EXAM: PORTABLE CHEST 1 VIEW COMPARISON:  0558 hours today. FINDINGS: Portable AP semi upright view at 1034 hours. Single right chest tube has been removed. Right chest wall, axillary, and supraclavicular subcutaneous gas appears stable. No pneumothorax identified. Mildly improved lung volumes. Stable cardiac size and mediastinal contours. No new pulmonary opacity. No pleural effusion identified. IMPRESSION: 1. Right chest tube removed with no pneumothorax identified. 2. No new cardiopulmonary abnormality. Electronically Signed   By: Odessa Fleming M.D.   On: 10/10/2015 11:08   Dg Chest Port 1 View  10/10/2015  CLINICAL DATA:  Right-sided pneumothorax, chest tube treatment. Sepsis, since the hips systemic inflammatory response syndrome cough, healthcare associated pneumonia. EXAM: PORTABLE CHEST 1 VIEW COMPARISON:  Portable chest x-ray of Oct 09, 2015 FINDINGS: The lungs are adequately inflated. The right chest tube has been pulled inferiorly with its tip now lying over the medial aspect of the sixth rib. It formerly overlay the  medial aspect of the third rib. No pneumothorax is evident but there is persistent right axillary subcutaneous emphysema. There is no pleural effusion. The left lung is clear. The heart is normal in size. The pulmonary vascularity is not engorged. IMPRESSION: Fairly stable appearance of the chest. A discrete pneumothorax on the right is not observed. There has been interval partial withdrawal of the chest tube but the proximal port and tip appear to remain within the thoracic cavity. Electronically Signed   By: David  Swaziland M.D.   On: 10/10/2015 07:46   Dg Chest Port 1 View  10/09/2015  CLINICAL DATA:  Follow-up right pneumothorax EXAM:  PORTABLE CHEST 1 VIEW COMPARISON:  Two days ago FINDINGS: Right apical chest tube in unchanged position. No visible pneumothorax. Right chest wall gas is extensive but without convincing increase. Volume loss and asymmetric density on the right that is presumably from atelectasis. The left lung is comparatively clear. Normal heart size and aortic contours. IMPRESSION: No visible pneumothorax.  Right chest tube in similar position. Electronically Signed   By: Marnee Spring M.D.   On: 10/09/2015 08:03   Dg Chest Portable 1 View  10/07/2015  CLINICAL DATA:  82 year old female with right-sided pneumothorax status post right sided chest tube placement. EXAM: PORTABLE CHEST 1 VIEW COMPARISON:  Chest radiograph dated 10/07/2015. FINDINGS: There has been interval placement of a right-sided chest tube the tip in the right apical region. There has been interval re-expansion of the right lung. No definite residual pneumothorax identified on this study. Patchy and linear areas of pulmonary density predominantly in the right upper lobe may represent atelectatic changes. The left lung is clear. Top-normal cardiac size. Right chest wall soft tissue emphysema noted. No acute osseous pathology. IMPRESSION: Interval placement of a right-sided chest tube with re-expansion of the right lung. No  definite residual pneumothorax identified. Electronically Signed   By: Elgie Collard M.D.   On: 10/07/2015 23:04   Dg Chest Portable 1 View  10/07/2015  CLINICAL DATA:  82 year old female with shortness of breath and altered mental status. EXAM: PORTABLE CHEST 1 VIEW COMPARISON:  Chest radiograph dated 11/13/2014 FINDINGS: There is a large right-sided pneumothorax with complete collapse of the right lung. There is apparent shift of the mediastinum to the left hemithorax which may be partially related to patient rotation. A degree of tension pneumothorax is not excluded. Clinical correlation is recommended. There is increased interstitial markings throughout the left lung. There is no pneumothorax on the left. There is mild cardiomegaly. No acute osseous pathology identified. IMPRESSION: Large right-sided pneumothorax with complete collapse of the right lung and mild shift of the mediastinum into the left hemithorax. Critical Value/emergent results were called by telephone at the time of interpretation on 10/07/2015 at 9:59 pm to Dr. Rolland Porter , who verbally acknowledged these results. Electronically Signed   By: Elgie Collard M.D.   On: 10/07/2015 22:01   Dg Foot Complete Left  10/03/2015  CLINICAL DATA:  Twisted left foot getting out of shower this morning. Inability to bear weight. Initial encounter. EXAM: LEFT FOOT - COMPLETE 3+ VIEW COMPARISON:  None. FINDINGS: There is no evidence of fracture or dislocation. There is no evidence of arthropathy. Mild hallux valgus and bony bunion formation seen. Generalized osteopenia also noted. Soft tissues are unremarkable. IMPRESSION: No acute findings. Electronically Signed   By: Myles Rosenthal M.D.   On: 10/03/2015 19:52    Microbiology: Recent Results (from the past 240 hour(s))  Urine culture     Status: None   Collection Time: 10/07/15 11:26 PM  Result Value Ref Range Status   Specimen Description URINE, CLEAN CATCH  Final   Special Requests NONE  Final    Culture   Final    NO GROWTH 2 DAYS Performed at [facility]    Report Status 10/10/2015 FINAL  Final  MRSA PCR Screening     Status: None   Collection Time: 10/08/15  1:10 AM  Result Value Ref Range Status   MRSA by PCR NEGATIVE NEGATIVE Final    Comment:        The GeneXpert MRSA Assay (FDA approved for  NASAL specimens only), is one component of a comprehensive MRSA colonization surveillance program. It is not intended to diagnose MRSA infection nor to guide or monitor treatment for MRSA infections.      Labs: Basic Metabolic Panel:  Recent Labs Lab 10/07/15 2220 10/08/15 0609  NA 140 140  K 3.7 3.9  CL 105 108  CO2 28 26  GLUCOSE 106* 99  BUN 20 11  CREATININE 0.66 0.42*  CALCIUM 9.4 9.1   Liver Function Tests:  Recent Labs Lab 10/08/15 0609  AST 24  ALT 19  ALKPHOS 68  BILITOT 0.5  PROT 7.2  ALBUMIN 3.2*   CBC:  Recent Labs Lab 10/07/15 2220 10/08/15 0609  WBC 8.2 8.5  NEUTROABS 6.4  --   HGB 12.6 11.6*  HCT 38.8 35.0*  MCV 98.0 96.7  PLT 191 170    Signed:  Kitt Minardi MD. Jerrel IvoryFACP.  [facility] 10/10/2015, 1:53 PM

## 2015-10-10 NOTE — Care Management (Signed)
[facility] [place] [facility], unable to take patient insurance . Referral placed with [facility] who will deliver equipment to facility tomorrow at the latest. . Ollen Grossontacted Ann Long [phone] at [facility] who stated that that was OK.

## 2015-10-10 NOTE — Progress Notes (Signed)
[facility] today per Dr. Conley RollsLe.  Pt's IV site's D/C'd and WDL.  Pt's VSS.  Pt left floor via WC in stable condition accompanied by NT.

## 2015-10-10 NOTE — Care Management (Signed)
Spoke with Drake LeachAnn Long at [facility],  Who requested hospital bed and wheelchair for the patient. I agreed to ask MD for order. Dewayne Hatchnn stated that they were able to accept patient and would not have to wait for equipment. Orders placed and awaiting call back from [facility] for insurance verification. Pt will transport via EMS.

## 2015-10-10 NOTE — Care Management (Signed)
Patient suffers from Mobility impairment, which impairs their ability to perform daily activities like ambulating in the home.  A walker or cane will not resolve issue with performing activities of daily living. A wheelchair will allow patient to safely perform daily activities. Patient can safely propel the wheelchair in the home or has a caregiver who can provide assistance.

## 2015-10-10 NOTE — NC FL2 (Signed)
[place] MEDICAID FL2 LEVEL OF CARE SCREENING TOOL     IDENTIFICATION  Patient Name: [name] Reynolds Birthdate: 1928-12-27 Sex: female Admission Date (Current Location): 10/07/2015  [facility] [place]   Facility and Address:  [facility],  [address], [place] [id number]      Provider Number: [id number]  Attending Physician Name and Address:  Houston SirenPeter Jeremy Ditullio, MD  Relative Name and Phone Number:       Current Level of Care: Hospital Recommended Level of Care: Family Care Home Prior Approval Number:    Date Approved/Denied:   PASRR Number:    Discharge Plan: Other (Comment) ([name])    Current Diagnoses: Patient Active Problem List   Diagnosis Date Noted  . Tension pneumothorax 10/08/2015  . Pneumothorax 10/08/2015  . Pneumothorax on right   . Thrombocytopenia (HCC) 11/15/2014  . Sepsis (HCC) 11/15/2014  . Fecal impaction (HCC)   . CN (constipation)   . QT prolongation 11/14/2014  . Elevated LFTs   . HCAP (healthcare-associated pneumonia)   . Common bile duct dilation   . Nausea & vomiting 11/13/2014  . Nonverbal 11/13/2014  . Alzheimer's dementia   . Protein-calorie malnutrition, severe (HCC) 10/27/2014  . SIRS (systemic inflammatory response syndrome) (HCC) 10/13/2014  . FTT (failure to thrive) in adult 10/13/2014  . Dehydration 10/13/2014  . Sepsis due to Escherichia coli (HCC) 10/12/2014  . Encephalopathy 10/12/2014  . Transaminitis 10/12/2014    Orientation RESPIRATION BLADDER Height & Weight        Normal Incontinent Weight: 111 lb 15.9 oz (50.8 kg) Height:  5\' 7"  (170.2 cm)  BEHAVIORAL SYMPTOMS/MOOD NEUROLOGICAL BOWEL NUTRITION STATUS      Incontinent Diet (Diet Dys 1. Diet Heart Healthy)  AMBULATORY STATUS COMMUNICATION OF NEEDS Skin   Limited Assist Does not communicate Surgical wounds (Right rib area)                       Personal Care Assistance Level of Assistance  Bathing, Feeding,  Dressing Bathing Assistance: Maximum assistance Feeding assistance: Maximum assistance Dressing Assistance: Maximum assistance     Functional Limitations Info  Sight, Hearing, Speech Sight Info: Adequate Hearing Info: Adequate Speech Info: Impaired    SPECIAL CARE FACTORS FREQUENCY                       Contractures      Additional Factors Info  Psychotropic Code Status Info:  (DNR)   Psychotropic Info:  (Ativan)         Current Medications (10/10/2015):  This is the current hospital active medication list Current Facility-Administered Medications  Medication Dose Route Frequency Provider Last Rate Last Dose  . 0.9 %  sodium chloride infusion   Intravenous Continuous Meredeth IdeGagan S Lama, MD 10 mL/hr at 10/08/15 0600    . acetaminophen (TYLENOL) tablet 650 mg  650 mg Oral Q6H PRN Meredeth IdeGagan S Lama, MD       Or  . acetaminophen (TYLENOL) suppository 650 mg  650 mg Rectal Q6H PRN Meredeth IdeGagan S Lama, MD      . amLODipine (NORVASC) tablet 5 mg  5 mg Oral Daily Meredeth IdeGagan S Lama, MD   5 mg at 10/10/15 0818  . antiseptic oral rinse (CPC / CETYLPYRIDINIUM CHLORIDE 0.05%) solution 7 mL  7 mL Mouth Rinse BID Meredeth IdeGagan S Lama, MD   7 mL at 10/10/15 0824  . benztropine (COGENTIN) tablet 0.5 mg  0.5 mg  Oral BID Meredeth Ide, MD   0.5 mg at 10/10/15 0818  . LORazepam (ATIVAN) injection 1 mg  1 mg Intravenous Q4H PRN Meredeth Ide, MD   1 mg at 10/10/15 4098  . memantine (NAMENDA) tablet 5 mg  5 mg Oral BID Meredeth Ide, MD   5 mg at 10/10/15 1191     Discharge Medications: Please see discharge summary for a list of discharge medications.  Relevant Imaging Results:  Relevant Lab Results:   Additional Information    [name], [name], [name]

## 2015-10-10 NOTE — Clinical Social Work Note (Signed)
CSW facilitated discharge.  Patient's nurse case manager notified patient's sister who was at bedside that patient was being discharged today.   CSW notified Max at [facility] that patient was being discharged today.   CSW sent clinicals via [employer] and arranged transportation.   CSW signing off.   Arun Herrod, Juleen ChinaHeather D, [name]

## 2015-10-10 NOTE — Care Management Note (Signed)
Case Management Note  Patient Details  Name: [name] Reynolds MRN: [id number] Date of Birth: 1928/06/04  To room to speak with patient daughter [name] Reynolds for discharge plan today and daughter stated that she had discussed with husband and that bringing patient home right now would not be the best plan at this time. Informed Dr Conley RollsLe  Of plan . Plan is for patient to discharge back to [facility] today.  CSW is following.                     Action/Plan: return to ALF    Expected Discharge Date:                  Expected Discharge Plan:  Assisted Living / Rest Home  In-House Referral:  Clinical Social Work  Discharge planning Services  CM Consult  Post Acute Care Choice:    Choice offered to:     DME Arranged:    DME Agency:     HH Arranged:    HH Agency:     Status of Service:  Completed, signed off  Medicare Important Message Given:  Yes Date Medicare IM Given:    Medicare IM give by:    Date Additional Medicare IM Given:    Additional Medicare Important Message give by:     If discussed at Long Length of Stay Meetings, dates discussed:    Additional Comments:  Adonis HugueninBerkhead, Abass Misener L, RN 10/10/2015, 1:42 PM

## 2015-10-10 NOTE — Progress Notes (Signed)
  Subjective: Patient noncommunicative  Objective: Vital signs in last 24 hours: Temp:  [97.5 F (36.4 C)-98.7 F (37.1 C)] 97.5 F (36.4 C) (05/10 0818) Pulse Rate:  [109-120] 109 (05/10 0818) Resp:  [20] 20 (05/10 0818) BP: (106-140)/(56-69) 140/69 mmHg (05/10 0818) SpO2:  [93 %-97 %] 93 % (05/10 0818) Last BM Date:  (known)  Intake/Output from previous day: 05/09 0701 - 05/10 0700 In: 360 [P.O.:360] Out: -  Intake/Output this shift: Total I/O In: -  Out: 863 [Urine:825; Chest Tube:38]  General appearance: no distress Resp: clear to auscultation bilaterally and No air leak in the Pleur-evac.  Lab Results:   Recent Labs  10/07/15 2220 10/08/15 0609  WBC 8.2 8.5  HGB 12.6 11.6*  HCT 38.8 35.0*  PLT 191 170   BMET  Recent Labs  10/07/15 2220 10/08/15 0609  NA 140 140  K 3.7 3.9  CL 105 108  CO2 28 26  GLUCOSE 106* 99  BUN 20 11  CREATININE 0.66 0.42*  CALCIUM 9.4 9.1   PT/INR  Recent Labs  10/07/15 2220  LABPROT 14.6  INR 1.12    Studies/Results: Dg Chest Port 1 View  10/10/2015  CLINICAL DATA:  Right-sided pneumothorax, chest tube treatment. Sepsis, since the hips systemic inflammatory response syndrome cough, healthcare associated pneumonia. EXAM: PORTABLE CHEST 1 VIEW COMPARISON:  Portable chest x-ray of Oct 09, 2015 FINDINGS: The lungs are adequately inflated. The right chest tube has been pulled inferiorly with its tip now lying over the medial aspect of the sixth rib. It formerly overlay the medial aspect of the third rib. No pneumothorax is evident but there is persistent right axillary subcutaneous emphysema. There is no pleural effusion. The left lung is clear. The heart is normal in size. The pulmonary vascularity is not engorged. IMPRESSION: Fairly stable appearance of the chest. A discrete pneumothorax on the right is not observed. There has been interval partial withdrawal of the chest tube but the proximal port and tip appear to remain  within the thoracic cavity. Electronically Signed   By: David  Swazil[place]Jordan M.D.   On: 10/10/2015 07:46   Dg Chest Port 1 View  10/09/2015  CLINICAL DATA:  Follow-up right pneumothorax EXAM: PORTABLE CHEST 1 VIEW COMPARISON:  Two days ago FINDINGS: Right apical chest tube in unchanged position. No visible pneumothorax. Right chest wall gas is extensive but without convincing increase. Volume loss and asymmetric density on the right that is presumably from atelectasis. The left lung is comparatively clear. Normal heart size and aortic contours. IMPRESSION: No visible pneumothorax.  Right chest tube in similar position. Electronically Signed   By: Marnee SpringJonathon  Watts M.D.   On: 10/09/2015 08:03    Anti-infectives: Anti-infectives    None      Assessment/Plan: Impression: Patient has been stable on waterseal with no apparent recurrent right pneumothorax. Chest tube has been removed and a follow-up chest x-ray has been ordered. Should this x-ray looked fine, patient may be discharged back to nursing home. A dressing should be kept over the chest tube site for 3 days. Follow-up chest x-ray should be performed if patient becomes symptomatic.  LOS: 2 days    Ethel Meisenheimer A 10/10/2015

## 2015-11-01 ENCOUNTER — Emergency Department (HOSPITAL_COMMUNITY): Payer: MEDICARE

## 2015-11-01 ENCOUNTER — Encounter (HOSPITAL_COMMUNITY): Payer: Self-pay

## 2015-11-01 ENCOUNTER — Emergency Department (HOSPITAL_COMMUNITY)
Admission: EM | Admit: 2015-11-01 | Discharge: 2015-11-01 | Disposition: A | Discharge status: Home / Self Care | Payer: MEDICARE | Attending: Emergency Medicine | Admitting: Emergency Medicine

## 2015-11-01 DIAGNOSIS — Z792 Long term (current) use of antibiotics: Secondary | ICD-10-CM | POA: Diagnosis not present

## 2015-11-01 DIAGNOSIS — F259 Schizoaffective disorder, unspecified: Secondary | ICD-10-CM | POA: Diagnosis not present

## 2015-11-01 DIAGNOSIS — E785 Hyperlipidemia, unspecified: Secondary | ICD-10-CM | POA: Diagnosis not present

## 2015-11-01 DIAGNOSIS — G309 Alzheimer's disease, unspecified: Secondary | ICD-10-CM | POA: Diagnosis not present

## 2015-11-01 DIAGNOSIS — J690 Pneumonitis due to inhalation of food and vomit: Secondary | ICD-10-CM | POA: Insufficient documentation

## 2015-11-01 DIAGNOSIS — Z79899 Other long term (current) drug therapy: Secondary | ICD-10-CM | POA: Diagnosis not present

## 2015-11-01 DIAGNOSIS — I10 Essential (primary) hypertension: Secondary | ICD-10-CM | POA: Insufficient documentation

## 2015-11-01 DIAGNOSIS — R4182 Altered mental status, unspecified: Secondary | ICD-10-CM | POA: Diagnosis present

## 2015-11-01 HISTORY — DX: Unspecified intellectual disabilities: F79

## 2015-11-01 LAB — CBC WITH DIFFERENTIAL/PLATELET
Basophils Absolute: 0 10*3/uL (ref 0.0–0.1)
Basophils Relative: 0 %
EOS PCT: 2 %
Eosinophils Absolute: 0.1 10*3/uL (ref 0.0–0.7)
HCT: 36.4 % (ref 36.0–46.0)
Hemoglobin: 11.8 g/dL — ABNORMAL LOW (ref 12.0–15.0)
LYMPHS ABS: 1.1 10*3/uL (ref 0.7–4.0)
LYMPHS PCT: 27 %
MCH: 31.6 pg (ref 26.0–34.0)
MCHC: 32.4 g/dL (ref 30.0–36.0)
MCV: 97.6 fL (ref 78.0–100.0)
MONO ABS: 0.8 10*3/uL (ref 0.1–1.0)
Monocytes Relative: 19 %
NEUTROS ABS: 2.1 10*3/uL (ref 1.7–7.7)
Neutrophils Relative %: 52 %
PLATELETS: 201 10*3/uL (ref 150–400)
RBC: 3.73 MIL/uL — ABNORMAL LOW (ref 3.87–5.11)
RDW: 13.5 % (ref 11.5–15.5)
WBC: 4.2 10*3/uL (ref 4.0–10.5)

## 2015-11-01 LAB — BASIC METABOLIC PANEL
Anion gap: 5 (ref 5–15)
BUN: 7 mg/dL (ref 6–20)
CALCIUM: 9.3 mg/dL (ref 8.9–10.3)
CO2: 28 mmol/L (ref 22–32)
CREATININE: 0.54 mg/dL (ref 0.44–1.00)
Chloride: 104 mmol/L (ref 101–111)
GFR calc Af Amer: >60 mL/min (ref >60)
GLUCOSE: 91 mg/dL (ref 65–99)
Potassium: 4.2 mmol/L (ref 3.5–5.1)
Sodium: 137 mmol/L (ref 135–145)

## 2015-11-01 LAB — HEPATIC FUNCTION PANEL
ALT: 13 U/L — ABNORMAL LOW (ref 14–54)
AST: 18 U/L (ref 15–41)
Albumin: 3.2 g/dL — ABNORMAL LOW (ref 3.5–5.0)
Alkaline Phosphatase: 101 U/L (ref 38–126)
BILIRUBIN DIRECT: 0.1 mg/dL (ref 0.1–0.5)
BILIRUBIN TOTAL: 0.5 mg/dL (ref 0.3–1.2)
Indirect Bilirubin: 0.4 mg/dL (ref 0.3–0.9)
Total Protein: 6.9 g/dL (ref 6.5–8.1)

## 2015-11-01 LAB — URINE MICROSCOPIC-ADD ON: RBC / HPF: NONE SEEN RBC/hpf (ref 0–5)

## 2015-11-01 LAB — URINALYSIS, ROUTINE W REFLEX MICROSCOPIC
Bilirubin Urine: NEGATIVE
GLUCOSE, UA: NEGATIVE mg/dL
Hgb urine dipstick: NEGATIVE
Ketones, ur: NEGATIVE mg/dL
Nitrite: NEGATIVE
PROTEIN: NEGATIVE mg/dL
pH: 7.5 (ref 5.0–8.0)

## 2015-11-01 LAB — I-STAT CG4 LACTIC ACID, ED: Lactic Acid, Venous: 1.07 mmol/L (ref 0.5–2.0)

## 2015-11-01 MED ORDER — AMOXICILLIN-POT CLAVULANATE 875-125 MG PO TABS
1.0000 | ORAL_TABLET | Freq: Once | ORAL | Status: AC
  Administered 2015-11-01: 1 via ORAL
  Filled 2015-11-01: qty 1

## 2015-11-01 MED ORDER — AMOXICILLIN-POT CLAVULANATE 875-125 MG PO TABS: 1.0000 | ORAL_TABLET | Freq: Two times a day (BID) | ORAL | Status: DC

## 2015-11-01 NOTE — ED Notes (Signed)
[facility]Mary Motley [facility]. Patients sister states she will take her back to facility by pov.

## 2015-11-01 NOTE — Discharge Instructions (Signed)
Augmentin twice daily for 10 days See your doctor in 2 days for recheck  Xray shows a possible slight pneumonia at the bottom of the lung.

## 2015-11-01 NOTE — ED Provider Notes (Signed)
CSN: [id number]     Arrival date & time 11/01/15  1030 History  By signing my name below, I, [name] Reynolds, attest that this documentation has been prepared under the direction and in the presence of Eber Hong, MD.   Electronically Signed: Iona Reynolds, ED Scribe. 11/01/2015. 1:00 PM   Chief Complaint  Patient presents with  . Altered Mental Status   The history is provided by the EMS personnel. No language interpreter was used.  LEVEL 5 CAVEAT: DEMENTIA HPI Comments: [name] Reynolds is a 82 y.o. female with PMHx of HTN, HLD, and dementia who presents to the Emergency Department for involuntary movement of limbs. Per nursing noted, pt from [facility] and began having agitation and involuntary movement of limbs around 9 AM this morning.Pt has history of same and says pt had pneumonia when this happened before. Pt nonverbal at baseline. Pt was at baseline earlier this morning and ate well.Upon examination, pt is close to baseline but still having involuntary limb movement.  Past Medical History  Diagnosis Date  . HTN (hypertension)   . Hyperlipemia   . Seizures (HCC)   . GERD (gastroesophageal reflux disease)   . Alzheimer's dementia   . Insomnia   . Vitamin D deficiency   . Schizoaffective disorder (HCC)   . Pneumonia     recent d/c from [facility] related to this  . History of blood culture     11-13-14 "E. Coli" -hospitalized at [facility]  . Mobility impaired     11-22-14 presently too weak for ambulation  . Speech articulation disorder     pt. not always verbal per [facility] care Staff.  . Mental retardation    Past Surgical History  Procedure Laterality Date  . Stomach surgery    . Eus N/A 11/29/2014    Procedure: ESOPHAGEAL ENDOSCOPIC ULTRASOUND (EUS) RADIAL;  Surgeon: Willis Modena, MD;  Location: [facility] ENDOSCOPY;  Service: Endoscopy;  Laterality: N/A;   No family history on file. Social History  Substance Use Topics  . Smoking status: Never Smoker   .  Smokeless tobacco: None  . Alcohol Use: No   OB History    No data available     Review of Systems  Unable to perform ROS: Dementia     Allergies  Review of patient's allergies indicates no known allergies.  Home Medications   Prior to Admission medications   Medication Sig Start Date End Date Taking? Authorizing Provider  acetaminophen (TYLENOL) 325 MG tablet Take 2 tablets (650 mg total) by mouth every 6 (six) hours as needed for mild pain (or Fever >/= 101). 10/10/15  Yes Houston Siren, MD  amLODipine (NORVASC) 5 MG tablet Take 1 tablet (5 mg total) by mouth daily. 10/13/14  Yes Jeralyn Bennett, MD  aspirin EC 81 MG tablet Take 81 mg by mouth daily.   Yes Historical Provider, MD  benztropine (COGENTIN) 0.5 MG tablet Take 0.5 mg by mouth 2 (two) times daily.   Yes Historical Provider, MD  cholecalciferol (VITAMIN D) 1000 UNITS tablet Take 1,000 Units by mouth daily.   Yes Historical Provider, MD  clonazePAM (KLONOPIN) 0.5 MG tablet Take 0.5 mg by mouth 2 (two) times daily.   Yes Historical Provider, MD  famotidine (PEPCID) 20 MG tablet Take 20 mg by mouth daily.   Yes Historical Provider, MD  feeding supplement, ENSURE ENLIVE, (ENSURE ENLIVE) LIQD Take 237 mLs by mouth daily. 10/13/14  Yes Jeralyn Bennett, MD  hydrOXYzine (VISTARIL) 25 MG capsule Take 25  mg by mouth at bedtime.   Yes Historical Provider, MD  lactulose, encephalopathy, (GENERLAC) 10 GM/15ML SOLN Take 10 g by mouth 2 (two) times daily.   Yes Historical Provider, MD  Linaclotide Karlene Einstein) 145 MCG CAPS capsule Take 1 capsule (145 mcg total) by mouth daily. 11/17/14  Yes Erick Blinks, MD  memantine (NAMENDA) 5 MG tablet Take 5 mg by mouth 2 (two) times daily.   Yes Historical Provider, MD  pantoprazole (PROTONIX) 40 MG tablet Take 1 tablet (40 mg total) by mouth daily. 11/17/14  Yes Erick Blinks, MD  simvastatin (ZOCOR) 40 MG tablet Take 40 mg by mouth at bedtime.   Yes Historical Provider, MD  traZODone (DESYREL) 100 MG  tablet Take 100 mg by mouth at bedtime.    Yes Historical Provider, MD  amoxicillin-clavulanate (AUGMENTIN) 875-125 MG tablet Take 1 tablet by mouth every 12 (twelve) hours. 11/01/15   Eber Hong, MD   BP 116/67 mmHg  Pulse 96  Resp 31  SpO2 100% Physical Exam  Constitutional: She appears well-developed and well-nourished. No distress.  Would not follow commands during examination.  HENT:  Head: Normocephalic and atraumatic.  Eyes: Conjunctivae and EOM are normal. Pupils are equal, round, and reactive to light. Right eye exhibits no discharge. Left eye exhibits no discharge. No scleral icterus.  Neck: Normal range of motion. Neck supple. No JVD present. No thyromegaly present.  Cardiovascular: Normal rate, regular rhythm, normal heart sounds and intact distal pulses.  Exam reveals no gallop and no friction rub.   No murmur heard. Borderline tachycardia.  Pulmonary/Chest: Effort normal and breath sounds normal. No respiratory distress. She has no wheezes. She has no rales.  Abdominal: Soft. Bowel sounds are normal. She exhibits no distension and no mass. There is no tenderness.  Soft, no pulsating mass.  Musculoskeletal: Normal range of motion. She exhibits no edema or tenderness.  Would not squeeze physician's hands. Moves all four extremities spontaneously. Joints are supple. NO warmth or effusion to joints.  Lymphadenopathy:    She has no cervical adenopathy.  Neurological: She is alert. Coordination normal.  MAEX4 - does not follow commands other than to sit up - has frequent movements of legs - non coordinated, non rhythmic.  Skin: Skin is warm and dry. No rash noted. No erythema.  Psychiatric: She has a normal mood and affect. Her behavior is normal.  Nursing note and vitals reviewed.   ED Course  Procedures (including critical care time) DIAGNOSTIC STUDIES: Oxygen Saturation is 100% on RA, normal by my interpretation.    COORDINATION OF CARE: 10:42 AM Discussed treatment  plan with pt at bedside and pt agreed to plan.   Labs Review Labs Reviewed  URINALYSIS, ROUTINE W REFLEX MICROSCOPIC (NOT AT [facility]) - Abnormal; Notable for the following:    Specific Gravity, Urine <1.005 (*)    Leukocytes, UA MODERATE (*)    All other components within normal limits  CBC WITH DIFFERENTIAL/PLATELET - Abnormal; Notable for the following:    RBC 3.73 (*)    Hemoglobin 11.8 (*)    All other components within normal limits  HEPATIC FUNCTION PANEL - Abnormal; Notable for the following:    Albumin 3.2 (*)    ALT 13 (*)    All other components within normal limits  URINE MICROSCOPIC-ADD ON - Abnormal; Notable for the following:    Squamous Epithelial / LPF 0-5 (*)    Bacteria, UA MANY (*)    All other components within normal limits  URINE  CULTURE  BASIC METABOLIC PANEL  I-STAT CG4 LACTIC ACID, ED    Imaging Review Dg Chest Portable 1 View  11/01/2015  CLINICAL DATA:  Patient with altered mental status. History of Alzheimer's disease. EXAM: PORTABLE CHEST 1 VIEW COMPARISON:  Chest radiograph 10/10/2015 ; 10/09/2015. FINDINGS: Patient is rotated to the left. Monitoring leads overlie the patient. Stable cardiac and mediastinal contours. Right lower lung consolidative opacity. Unchanged biapical opacities and/or scarring. No pleural effusion or pneumothorax. IMPRESSION: Consolidative opacities right lower lung may represent atelectasis or infection. Electronically Signed   By: Annia Beltrew  Davis M.D.   On: 11/01/2015 10:52   I have personally reviewed and evaluated these images and lab results as part of my medical decision-making.    MDM   Final diagnoses:  Aspiration pneumonia, unspecified aspiration pneumonia type, unspecified laterality, unspecified part of lung (HCC)    I personally performed the services described in this documentation, which was scribed in my presence. The recorded information has been reviewed and is accurate.   The xray shows possible slight PNA - VS  without significant tachypnea - sat's are 100%, the pt has a poor tracing and is very wiggly (baseline per pt's sister who is at bedside).  She has good waveform at 100% when still.  Labs unremarkable - overall pt appears stable for d/c.  Augmentin for possible aspiration / UTI - culture ordered.  Meds given in ED:  Medications  amoxicillin-clavulanate (AUGMENTIN) 875-125 MG per tablet 1 tablet (1 tablet Oral Given 11/01/15 1235)    New Prescriptions   AMOXICILLIN-CLAVULANATE (AUGMENTIN) 875-125 MG TABLET    Take 1 tablet by mouth every 12 (twelve) hours.         Eber HongBrian Charlean Carneal, MD 11/01/15 [phone]

## 2015-11-01 NOTE — ED Notes (Signed)
[facility] and ems reports around 0900 pt started having agitation and involuntary movement of limbs.  Pt has history of same and says pt had pneumonia when this happened before.  Pt nonverbal at baseline.  Reports pt ate well last night and this morning.  BP 118/80 and cbg at facility was 109.

## 2015-11-04 LAB — URINE CULTURE: Culture: >=100000 — AB

## 2015-11-05 ENCOUNTER — Telehealth (HOSPITAL_BASED_OUTPATIENT_CLINIC_OR_DEPARTMENT_OTHER): Payer: Self-pay | Admitting: Emergency Medicine

## 2015-11-05 NOTE — Telephone Encounter (Signed)
Post ED Visit - Positive Culture Follow-up  Culture report reviewed by antimicrobial stewardship pharmacist:  []  Enzo BiNathan Batchelder, Pharm.D. []  Celedonio MiyamotoJeremy Frens, Pharm.D., BCPS []  Garvin FilaMike Maccia, Pharm.D. []  Georgina PillionElizabeth Martin, Pharm.D., BCPS []  Forest MeadowsMinh Pham, [address]Pharm[address]D., BCPS, AAHIVP []  Estella HuskMichelle Turner, Pharm.D., BCPS, AAHIVP [x]  Tennis Mustassie Stewart, Pharm.D. []  Sherle Poeob Vincent, [address]Pharm.D.  Positive urine culture Treated with augmentin, organism sensitive to the same and no further patient follow-up is required at this time.  Berle MullMiller, Stela Iwasaki 11/05/2015, 10:47 AM

## 2017-05-28 ENCOUNTER — Other Ambulatory Visit: Payer: Self-pay

## 2017-05-28 ENCOUNTER — Encounter (HOSPITAL_COMMUNITY): Payer: Self-pay

## 2017-05-28 ENCOUNTER — Emergency Department (HOSPITAL_COMMUNITY)
Admission: EM | Admit: 2017-05-28 | Discharge: 2017-05-28 | Disposition: A | Discharge status: Home / Self Care | Payer: MEDICARE | Attending: Emergency Medicine | Admitting: Emergency Medicine

## 2017-05-28 ENCOUNTER — Emergency Department (HOSPITAL_COMMUNITY): Payer: MEDICARE

## 2017-05-28 DIAGNOSIS — E785 Hyperlipidemia, unspecified: Secondary | ICD-10-CM | POA: Insufficient documentation

## 2017-05-28 DIAGNOSIS — G309 Alzheimer's disease, unspecified: Secondary | ICD-10-CM | POA: Insufficient documentation

## 2017-05-28 DIAGNOSIS — Z7982 Long term (current) use of aspirin: Secondary | ICD-10-CM | POA: Insufficient documentation

## 2017-05-28 DIAGNOSIS — Z79899 Other long term (current) drug therapy: Secondary | ICD-10-CM | POA: Diagnosis not present

## 2017-05-28 DIAGNOSIS — I10 Essential (primary) hypertension: Secondary | ICD-10-CM | POA: Diagnosis not present

## 2017-05-28 DIAGNOSIS — R451 Restlessness and agitation: Secondary | ICD-10-CM | POA: Diagnosis present

## 2017-05-28 LAB — CBC WITH DIFFERENTIAL/PLATELET
Basophils Absolute: 0 10*3/uL (ref 0.0–0.1)
Basophils Relative: 0 %
EOS PCT: 0 %
Eosinophils Absolute: 0 10*3/uL (ref 0.0–0.7)
HEMATOCRIT: 36.1 % (ref 36.0–46.0)
Hemoglobin: 11.7 g/dL — ABNORMAL LOW (ref 12.0–15.0)
LYMPHS ABS: 0.8 10*3/uL (ref 0.7–4.0)
LYMPHS PCT: 12 %
MCH: 31.7 pg (ref 26.0–34.0)
MCHC: 32.4 g/dL (ref 30.0–36.0)
MCV: 97.8 fL (ref 78.0–100.0)
MONO ABS: 0.6 10*3/uL (ref 0.1–1.0)
MONOS PCT: 8 %
NEUTROS ABS: 5.5 10*3/uL (ref 1.7–7.7)
Neutrophils Relative %: 80 %
PLATELETS: 132 10*3/uL — AB (ref 150–400)
RBC: 3.69 MIL/uL — ABNORMAL LOW (ref 3.87–5.11)
RDW: 13 % (ref 11.5–15.5)
WBC: 6.9 10*3/uL (ref 4.0–10.5)

## 2017-05-28 LAB — BASIC METABOLIC PANEL
Anion gap: 9 (ref 5–15)
BUN: 16 mg/dL (ref 6–20)
CALCIUM: 9.5 mg/dL (ref 8.9–10.3)
CO2: 27 mmol/L (ref 22–32)
Chloride: 103 mmol/L (ref 101–111)
Creatinine, Ser: 0.6 mg/dL (ref 0.44–1.00)
GFR calc Af Amer: >60 mL/min (ref >60)
GLUCOSE: 107 mg/dL — AB (ref 65–99)
Potassium: 3.8 mmol/L (ref 3.5–5.1)
Sodium: 139 mmol/L (ref 135–145)

## 2017-05-28 NOTE — ED Provider Notes (Signed)
[facility] EMERGENCY DEPARTMENT Provider Note   CSN: [id number] Arrival date & time: 05/28/17  1948     History   Chief Complaint Chief Complaint  Patient presents with  . Agitation    HPI [name] Reynolds is a 83 y.o. female.  Patient brought in by [facility] from [facility] patient has been with them for a while.  The very used to her routine.  Patient has a history of seizures.  Today she was in a chair and suddenly started to get combative and agitated and was sort of flailing her arms and legs everywhere.  Not like her normal seizures normally with a seizure she goes unconscious and then has tonic-clonic movement.  Has not a seizure for a long period of time.  Patient is now back to her baseline alertness mumbling incomprehensibly and occasionally will say her name family also has it that is normal.  Patient seemed to be fine earlier today patient's routines been normal has not been ill appearing.  There just was the sudden thing apparently this period of agitation lasted for about 45 minutes.  Blood sugar at the scene was normal.      Past Medical History:  Diagnosis Date  . Alzheimer's dementia   . GERD (gastroesophageal reflux disease)   . History of blood culture    11-13-14 "E. Coli" -hospitalized at [facility]  . HTN (hypertension)   . Hyperlipemia   . Insomnia   . Mental retardation   . Mobility impaired    11-22-14 presently too weak for ambulation  . Pneumonia    recent d/c from [facility] related to this  . Schizoaffective disorder (HCC)   . Seizures (HCC)   . Speech articulation disorder    pt. not always verbal per [facility] care Staff.  . Vitamin D deficiency     Patient Active Problem List   Diagnosis Date Noted  . Tension pneumothorax 10/08/2015  . Pneumothorax 10/08/2015  . Pneumothorax on right   . Thrombocytopenia (HCC) 11/15/2014  . Sepsis (HCC) 11/15/2014  . Fecal impaction (HCC)   . CN (constipation)   . QT prolongation  11/14/2014  . Elevated LFTs   . HCAP (healthcare-associated pneumonia)   . Common bile duct dilation   . Nausea & vomiting 11/13/2014  . Nonverbal 11/13/2014  . Alzheimer's dementia   . Protein-calorie malnutrition, severe (HCC) 10/27/2014  . SIRS (systemic inflammatory response syndrome) (HCC) 10/13/2014  . FTT (failure to thrive) in adult 10/13/2014  . Dehydration 10/13/2014  . Sepsis due to Escherichia coli (HCC) 10/12/2014  . Encephalopathy 10/12/2014  . Transaminitis 10/12/2014    Past Surgical History:  Procedure Laterality Date  . EUS N/A 11/29/2014   Procedure: ESOPHAGEAL ENDOSCOPIC ULTRASOUND (EUS) RADIAL;  Surgeon: Willis Modena, MD;  Location: [facility];  Service: Endoscopy;  Laterality: N/A;  . STOMACH SURGERY      OB History    No data available       Home Medications    Prior to Admission medications   Medication Sig Start Date End Date Taking? Authorizing Provider  acetaminophen (TYLENOL) 325 MG tablet Take 2 tablets (650 mg total) by mouth every 6 (six) hours as needed for mild pain (or Fever >/= 101). 10/10/15  Yes Houston Siren, MD  amLODipine (NORVASC) 5 MG tablet Take 1 tablet (5 mg total) by mouth daily. 10/13/14  Yes Jeralyn Bennett, MD  aspirin EC 81 MG tablet Take 81 mg by mouth daily.   Yes  [provider]  benztropine (COGENTIN) 0.5 MG tablet Take 0.5 mg by mouth 2 (two) times daily.   Yes [provider]  cholecalciferol (VITAMIN D) 1000 UNITS tablet Take 1,000 Units by mouth daily.   Yes [provider]  clonazePAM (KLONOPIN) 0.5 MG tablet Take 0.5 mg by mouth 2 (two) times daily.   Yes [provider]  famotidine (PEPCID) 20 MG tablet Take 20 mg by mouth daily.   Yes [provider]  feeding supplement, ENSURE ENLIVE, (ENSURE ENLIVE) LIQD Take 237 mLs by mouth daily. 10/13/14  Yes Jeralyn BennettZamora, Ezequiel, MD  hydrOXYzine (VISTARIL) 25 MG capsule Take 25 mg by mouth at bedtime.   Yes [provider]    lactulose, encephalopathy, (GENERLAC) 10 GM/15ML SOLN Take 10 g by mouth 2 (two) times daily.   Yes [provider]  Linaclotide (LINZESS) 145 MCG CAPS capsule Take 1 capsule (145 mcg total) by mouth daily. 11/17/14  Yes Erick BlinksMemon, Jehanzeb, MD  memantine (NAMENDA) 5 MG tablet Take 5 mg by mouth 2 (two) times daily.   Yes [provider]  pantoprazole (PROTONIX) 40 MG tablet Take 1 tablet (40 mg total) by mouth daily. 11/17/14  Yes Erick BlinksMemon, Jehanzeb, MD  simvastatin (ZOCOR) 40 MG tablet Take 40 mg by mouth at bedtime.   Yes [provider]  traZODone (DESYREL) 100 MG tablet Take 100 mg by mouth at bedtime.    Yes [provider]    Family History No family history on file.  Social History Social History   Tobacco Use  . Smoking status: Never Smoker  . Smokeless tobacco: Never Used  Substance Use Topics  . Alcohol use: No  . Drug use: No     Allergies   Patient has no known allergies.   Review of Systems Review of Systems  Unable to perform ROS: Dementia     Physical Exam Updated Vital Signs BP (!) 100/58   Pulse 95   Temp 98.2 F (36.8 C) (Oral)   Resp 20   SpO2 100%   Physical Exam  Constitutional: She appears well-developed and well-nourished. No distress.  HENT:  Head: Normocephalic and atraumatic.  Mucous membranes slightly dry  Eyes: Conjunctivae are normal.  Neck: Neck supple.  Cardiovascular: Normal rate and regular rhythm.  Pulmonary/Chest: Effort normal and breath sounds normal. No respiratory distress.  Abdominal: Soft. Bowel sounds are normal. She exhibits no distension. There is no tenderness.  Musculoskeletal: Normal range of motion. She exhibits no edema.  Neurological: She is alert.  Moving all 4 extremities spontaneously.  According to family members mental status is at baseline.  Skin: Skin is warm.  Nursing note and vitals reviewed.    ED Treatments / Results  Labs (all labs ordered are listed, but only  abnormal results are displayed) Labs Reviewed  CBC WITH DIFFERENTIAL/PLATELET - Abnormal; Notable for the following components:      Result Value   RBC 3.69 (*)    Hemoglobin 11.7 (*)    Platelets 132 (*)    All other components within normal limits  BASIC METABOLIC PANEL    EKG  EKG Interpretation None       Radiology Dg Chest 2 View  Result Date: 05/28/2017 CLINICAL DATA:  Ethel status, agitation, history hypertension, Alzheimer's, seizures EXAM: CHEST  2 VIEW COMPARISON:  11/01/2015 FINDINGS: Normal heart size, mediastinal contours, and pulmonary vascularity. Chronic bronchitic and interstitial changes. Calcified granuloma RIGHT upper lobe. No acute infiltrate, pleural effusion or pneumothorax. Bones demineralized.  IMPRESSION: Chronic bronchitic and interstitial changes. No acute abnormalities. Electronically Signed   By: Ulyses SouthwardMark  Boles M.D.   On: 05/28/2017 21:03   Ct Head Wo Contrast  Result Date: 05/28/2017 CLINICAL DATA:  Possible seizure EXAM: CT HEAD WITHOUT CONTRAST TECHNIQUE: Contiguous axial images were obtained from the base of the skull through the vertex without intravenous contrast. COMPARISON:  10/11/2014 FINDINGS: Brain: No acute territorial infarction, hemorrhage or intracranial mass is visualized. Mild small vessel ischemic changes of the white matter. Mild atrophy. Stable ventricle size Vascular: No hyperdense vessels.  Carotid artery calcification. Skull: No skull fracture. Sinuses/Orbits: Mucosal thickening in the ethmoid and maxillary sinuses. No acute orbital abnormality. Other: Minimal fluid in the left inferior mastoid. Density/debris in the right external auditory canal. IMPRESSION: 1. No CT evidence for acute intracranial abnormality. Mild atrophy and small vessel ischemic changes of the white matter 2. Prominent density/probable cerumen in the right external auditory canal, recommend correlation with direct visualization Electronically Signed   By: Jasmine PangKim  Fujinaga  M.D.   On: 05/28/2017 20:50    Procedures Procedures (including critical care time)  Medications Ordered in ED Medications - No data to display   Initial Impression / Assessment and Plan / ED Course  I have reviewed the triage vital signs and the nursing notes.  Pertinent labs & imaging results that were available during my care of the patient were reviewed by me and considered in my medical decision making (see chart for details).    The event that occurred at the family care home seems to be more consistent with an agitation than seizure.  Patient has been back to her baseline and acting normal here.  CT head without any intracranial process no skull fractures.  Chest x-ray negative.  Labs without significant abnormalities.  Patient's basic metabolic panel did not crossover from the computer.  But had no significant abnormalities.  No fever.  Blood pressure all above 100 systolic.  Family comfortable with patient going home.  If this outburst or agitation or combative behavior gets to be a normal process recommended they contact her primary care doctor for some adjustments in her meds.  But with this being an isolated event would not recommend any medication changes.     Final Clinical Impressions(s) / ED Diagnoses   Final diagnoses:  Agitation    ED Discharge Orders    None       Vanetta MuldersZackowski, Salimatou Simone, MD 05/28/17 2314

## 2017-05-28 NOTE — Discharge Instructions (Signed)
Workup here without any acute findings.  CT head without any acute brain or skull injuries.  Chest x-ray negative for pneumonia labs without significant abnormalities.  Would hold off on any medication adjustments unless this gets to be a recurrent problem.  Patient would probably need her primary care doctor to make those adjustments.  Patient stable for discharge home.

## 2017-05-28 NOTE — ED Triage Notes (Signed)
[facility] [place] [facility]   Ems was called out for possible seizures.  Per ems, pt was not having a seizure and has no history of same.  Pt is awake and alert, mumbling incomprehensibly.   Pt is nad.

## 2017-11-24 IMAGING — DX DG FOOT COMPLETE 3+V*L*
3 series · 3 of 3 positions shown · non-contrast
Comparison: None.

CLINICAL DATA: Twisted left foot getting out of shower this
morning. Inability to bear weight. Initial encounter.

EXAM:
LEFT FOOT - COMPLETE 3+ VIEW

[foot ap]
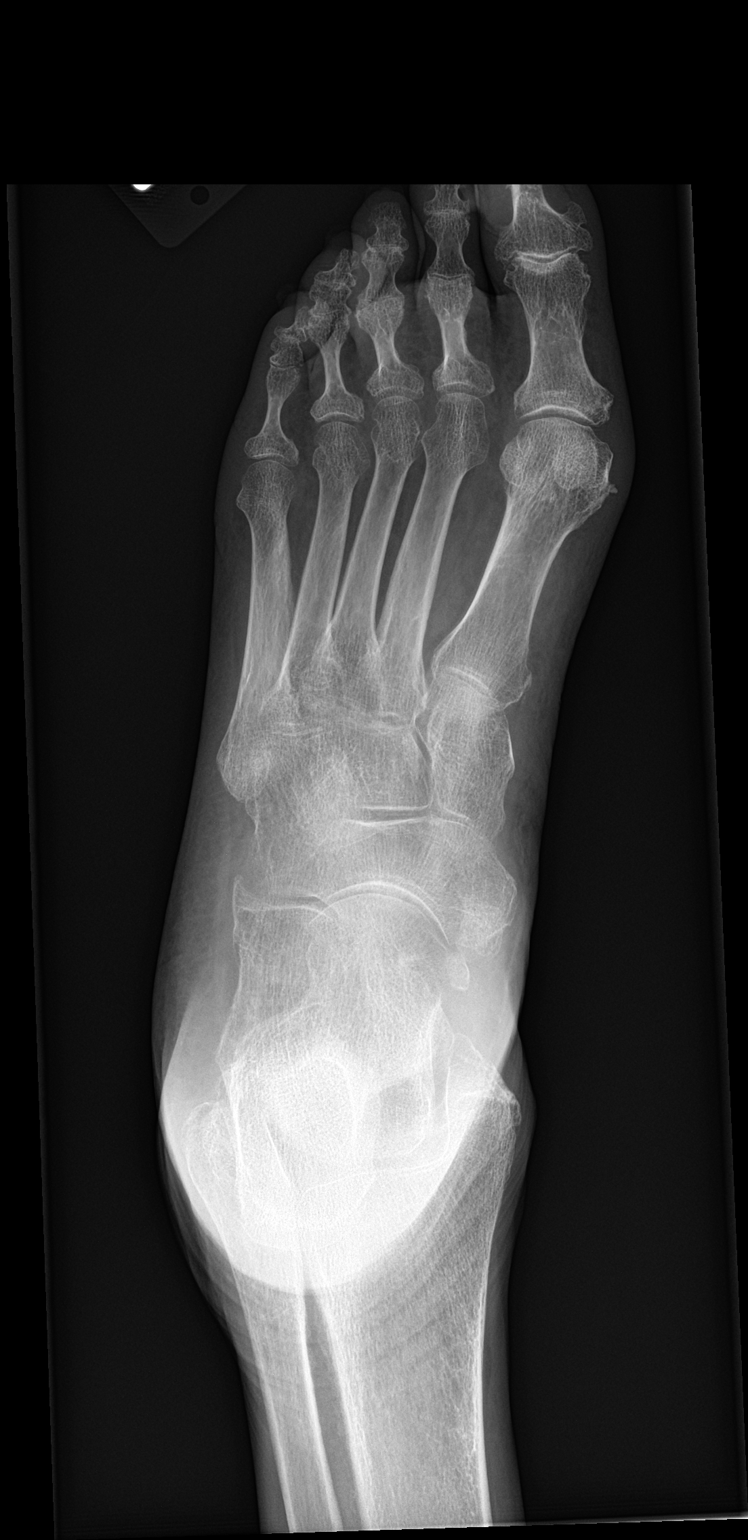

[foot obl]
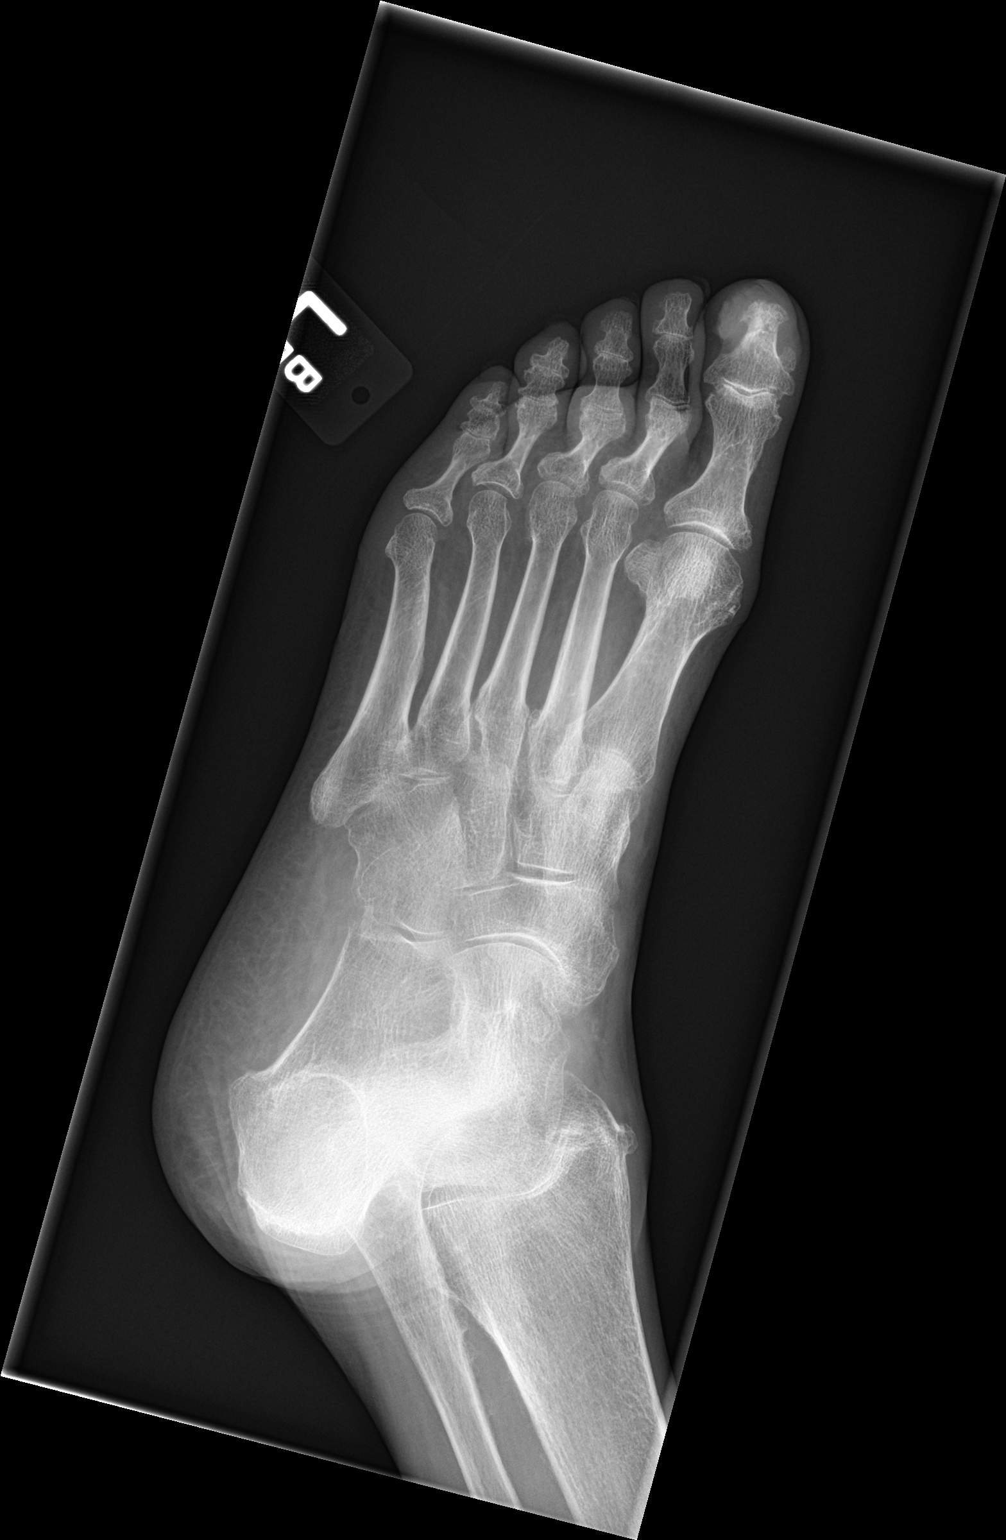

[foot lat]
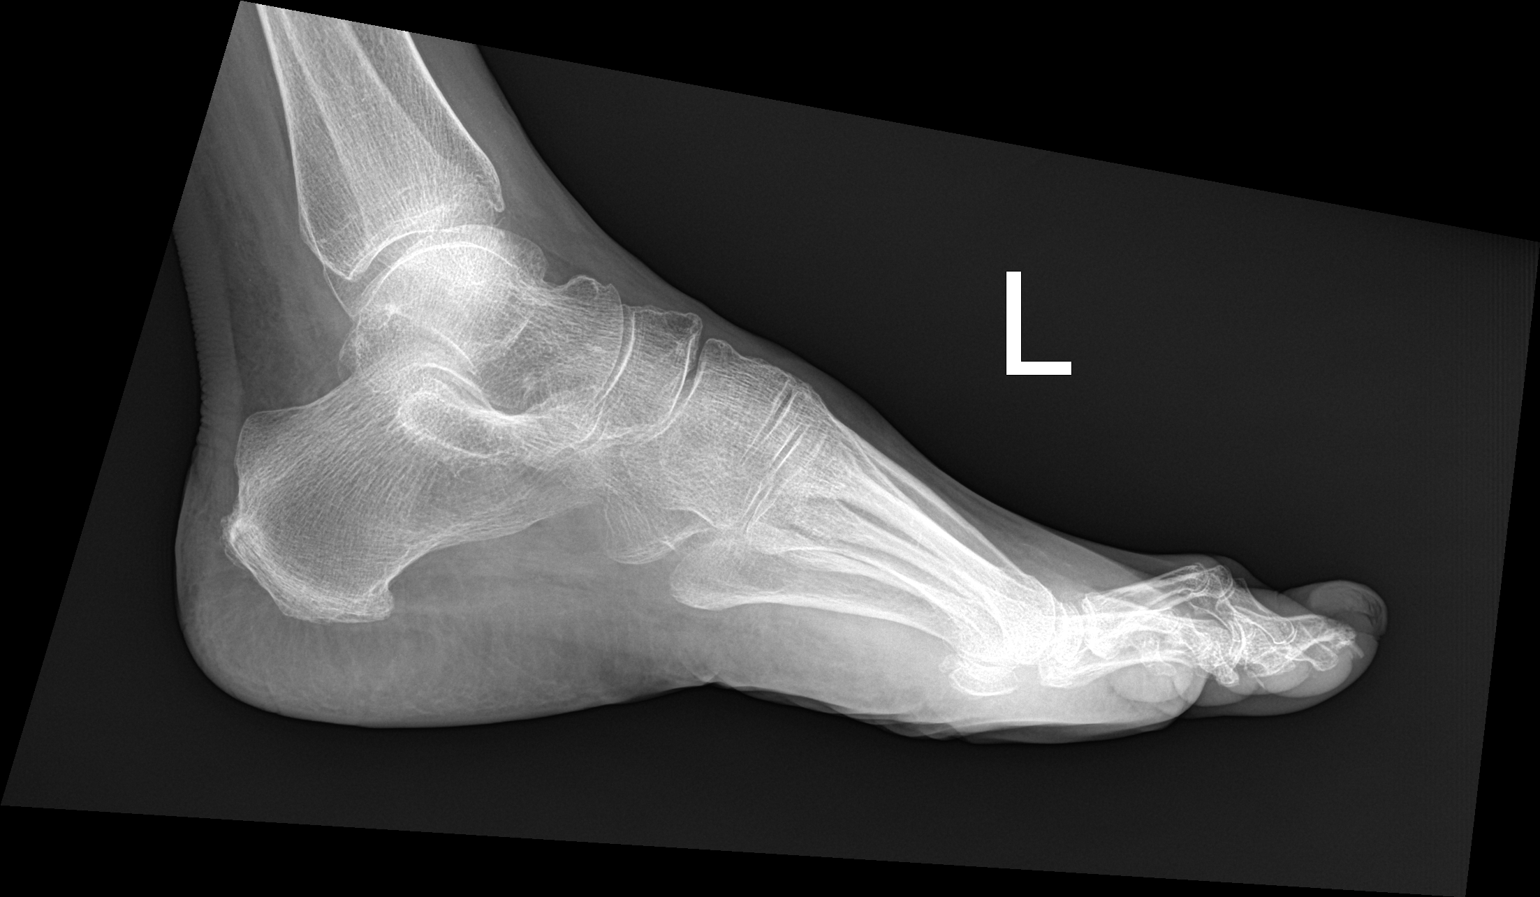

[3 of 3 positions shown; findings below may reference images not displayed]

FINDINGS: There is no evidence of fracture or dislocation. There is no
evidence of arthropathy. Mild hallux valgus and bony bunion
formation seen. Generalized osteopenia also noted. Soft tissues are
unremarkable.
IMPRESSION: No acute findings.

## 2017-11-28 IMAGING — CR DG CHEST 1V PORT
1 series · 2 of 2 positions shown · non-contrast
Comparison: Chest radiograph dated 10/07/2015.

CLINICAL DATA: 87-year-old female with right-sided pneumothorax
status post right sided chest tube placement.

EXAM:
PORTABLE CHEST 1 VIEW

[Series 1: ap portable · 0.17mm/px · 2 of 2 slices shown]
[im 1/2]
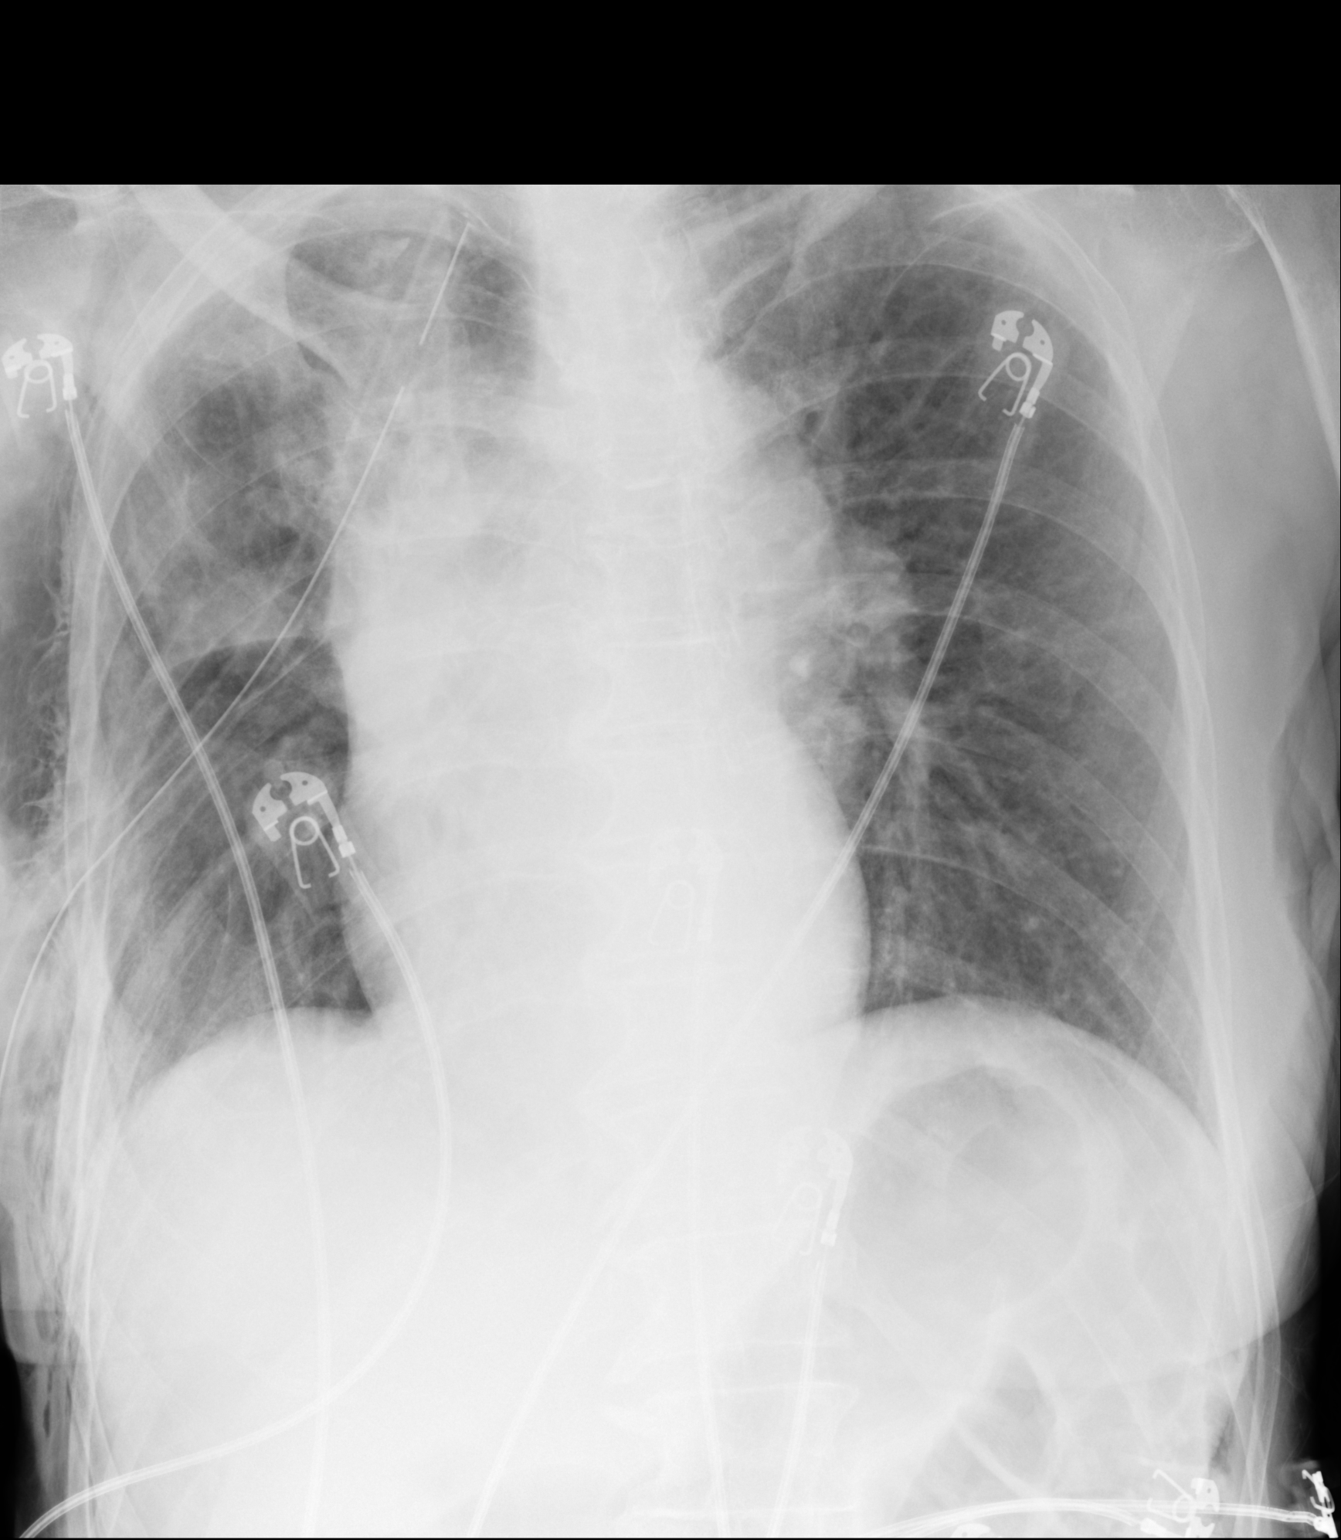
[im 2/2]
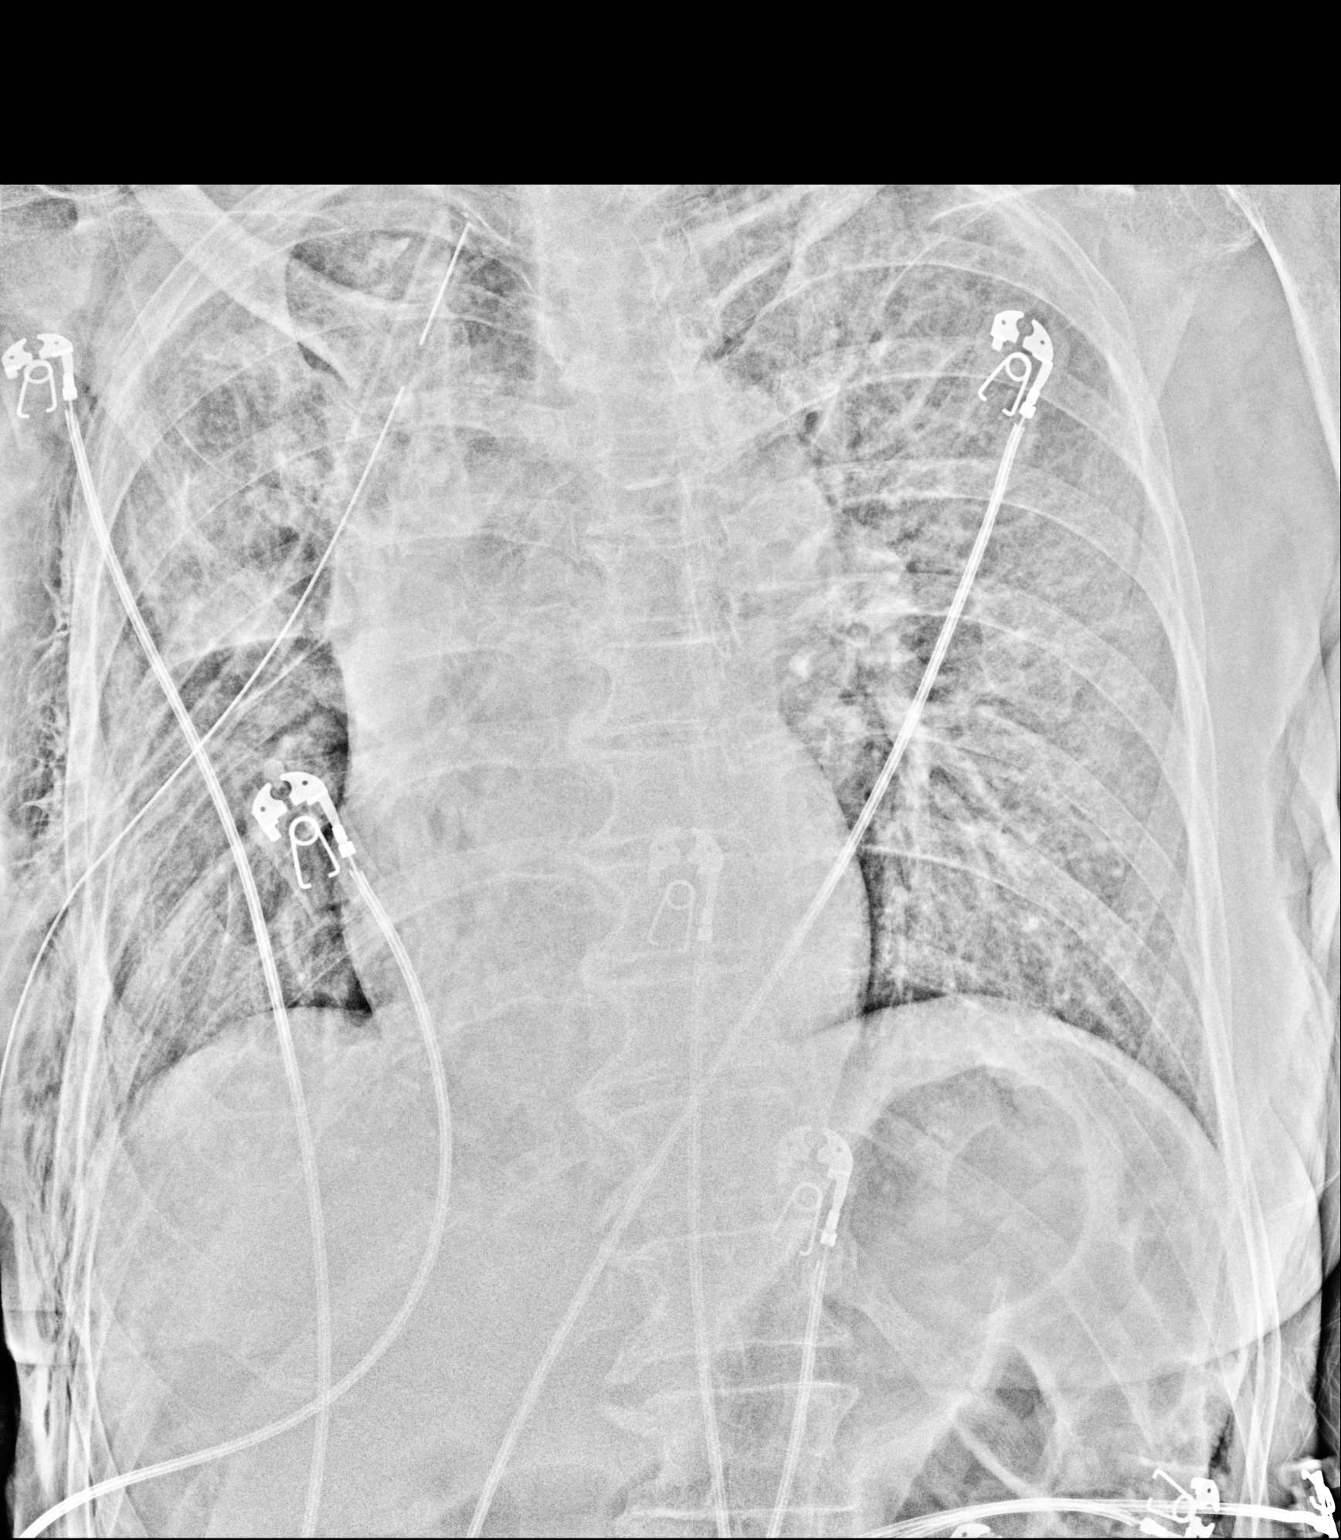

[2 of 2 positions shown; findings below may reference images not displayed]

FINDINGS: There has been interval placement of a right-sided chest tube the
tip in the right apical region. There has been interval re-expansion
of the right lung. No definite residual pneumothorax identified on
this study. Patchy and linear areas of pulmonary density
predominantly in the right upper lobe may represent atelectatic
changes. The left lung is clear. Top-normal cardiac size. Right
chest wall soft tissue emphysema noted. No acute osseous pathology.
IMPRESSION: Interval placement of a right-sided chest tube with re-expansion of
the right lung. No definite residual pneumothorax identified.

## 2020-04-02 DEATH — deceased
